# Patient Record
Sex: Female | Born: 1960
Health system: Southern US, Community
[De-identification: ages and names within clinical notes are randomized; demographics above are authoritative.]

## PROBLEM LIST (undated history)

## (undated) DIAGNOSIS — L409 Psoriasis, unspecified: Secondary | ICD-10-CM

## (undated) DIAGNOSIS — F4322 Adjustment disorder with anxiety: Secondary | ICD-10-CM

## (undated) DIAGNOSIS — R0789 Other chest pain: Secondary | ICD-10-CM

## (undated) DIAGNOSIS — E785 Hyperlipidemia, unspecified: Secondary | ICD-10-CM

## (undated) DIAGNOSIS — H9190 Unspecified hearing loss, unspecified ear: Secondary | ICD-10-CM

## (undated) HISTORY — DX: Psoriasis, unspecified: L40.9

## (undated) HISTORY — DX: Unspecified hearing loss, unspecified ear: H91.90

## (undated) HISTORY — DX: Hyperlipidemia, unspecified: E78.5

## (undated) HISTORY — PX: OTHER SURGICAL HISTORY: SHX169

---
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
## 1997-08-07 ENCOUNTER — Inpatient Hospital Stay (HOSPITAL_COMMUNITY): Admission: AD | Admit: 1997-08-07 | Discharge: 1997-08-07 | Payer: Self-pay | Admitting: Obstetrics and Gynecology

## 1997-08-28 ENCOUNTER — Inpatient Hospital Stay (HOSPITAL_COMMUNITY): Admission: AD | Admit: 1997-08-28 | Discharge: 1997-08-30 | Payer: Self-pay | Admitting: Obstetrics and Gynecology

## 1998-10-13 ENCOUNTER — Other Ambulatory Visit: Admission: RE | Admit: 1998-10-13 | Discharge: 1998-10-13 | Payer: Self-pay | Admitting: Obstetrics and Gynecology

## 1998-10-13 ENCOUNTER — Encounter: Payer: Self-pay | Admitting: Emergency Medicine

## 1998-10-13 ENCOUNTER — Emergency Department (HOSPITAL_COMMUNITY): Admission: EM | Admit: 1998-10-13 | Discharge: 1998-10-13 | Payer: Self-pay

## 1999-11-22 ENCOUNTER — Other Ambulatory Visit: Admission: RE | Admit: 1999-11-22 | Discharge: 1999-11-22 | Payer: Self-pay | Admitting: Obstetrics and Gynecology

## 2000-11-23 ENCOUNTER — Other Ambulatory Visit: Admission: RE | Admit: 2000-11-23 | Discharge: 2000-11-23 | Payer: Self-pay | Admitting: Obstetrics and Gynecology

## 2002-03-14 ENCOUNTER — Other Ambulatory Visit: Admission: RE | Admit: 2002-03-14 | Discharge: 2002-03-14 | Payer: Self-pay | Admitting: Obstetrics and Gynecology

## 2002-03-25 ENCOUNTER — Encounter: Admission: RE | Admit: 2002-03-25 | Discharge: 2002-03-25 | Payer: Self-pay | Admitting: Obstetrics and Gynecology

## 2002-03-25 ENCOUNTER — Encounter: Payer: Self-pay | Admitting: Obstetrics and Gynecology

## 2002-07-18 ENCOUNTER — Ambulatory Visit (HOSPITAL_COMMUNITY): Admission: RE | Admit: 2002-07-18 | Discharge: 2002-07-18 | Payer: Self-pay | Admitting: Obstetrics and Gynecology

## 2002-07-18 ENCOUNTER — Encounter (INDEPENDENT_AMBULATORY_CARE_PROVIDER_SITE_OTHER): Payer: Self-pay

## 2003-03-24 ENCOUNTER — Other Ambulatory Visit: Admission: RE | Admit: 2003-03-24 | Discharge: 2003-03-24 | Payer: Self-pay | Admitting: Obstetrics and Gynecology

## 2003-03-31 ENCOUNTER — Encounter: Admission: RE | Admit: 2003-03-31 | Discharge: 2003-03-31 | Payer: Self-pay | Admitting: Family Medicine

## 2004-04-05 ENCOUNTER — Encounter: Admission: RE | Admit: 2004-04-05 | Discharge: 2004-04-05 | Payer: Self-pay | Admitting: Obstetrics and Gynecology

## 2004-04-11 ENCOUNTER — Other Ambulatory Visit: Admission: RE | Admit: 2004-04-11 | Discharge: 2004-04-11 | Payer: Self-pay | Admitting: Obstetrics and Gynecology

## 2005-04-06 ENCOUNTER — Encounter: Admission: RE | Admit: 2005-04-06 | Discharge: 2005-04-06 | Payer: Self-pay | Admitting: Obstetrics and Gynecology

## 2005-05-30 ENCOUNTER — Other Ambulatory Visit: Admission: RE | Admit: 2005-05-30 | Discharge: 2005-05-30 | Payer: Self-pay | Admitting: Obstetrics and Gynecology

## 2006-04-09 ENCOUNTER — Encounter: Admission: RE | Admit: 2006-04-09 | Discharge: 2006-04-09 | Payer: Self-pay | Admitting: Obstetrics and Gynecology

## 2007-05-31 ENCOUNTER — Encounter: Admission: RE | Admit: 2007-05-31 | Discharge: 2007-05-31 | Payer: Self-pay | Admitting: Obstetrics and Gynecology

## 2007-10-01 ENCOUNTER — Ambulatory Visit: Payer: Self-pay | Admitting: Cardiology

## 2007-10-01 LAB — CONVERTED CEMR LAB
Alkaline Phosphatase: 61 units/L (ref 39–117)
BUN: 11 mg/dL (ref 6–23)
Bilirubin, Direct: 0.1 mg/dL (ref 0.0–0.3)
Calcium: 8.6 mg/dL (ref 8.4–10.5)
Cholesterol: 197 mg/dL (ref 0–200)
Direct LDL: 119.4 mg/dL
GFR calc Af Amer: 115 mL/min
GFR calc non Af Amer: 95 mL/min
Glucose, Bld: 82 mg/dL (ref 70–99)
HDL: 60.3 mg/dL (ref >39.0)
Potassium: 4.3 meq/L (ref 3.5–5.1)
Total CHOL/HDL Ratio: 3.3
VLDL: 14 mg/dL (ref 0–40)

## 2008-05-01 ENCOUNTER — Ambulatory Visit: Payer: Self-pay | Admitting: Cardiology

## 2008-05-01 LAB — CONVERTED CEMR LAB
Cholesterol: 201 mg/dL (ref 0–200)
Direct LDL: 117.4 mg/dL
Total CHOL/HDL Ratio: 3.2

## 2008-06-04 ENCOUNTER — Encounter: Admission: RE | Admit: 2008-06-04 | Discharge: 2008-06-04 | Payer: Self-pay | Admitting: Obstetrics and Gynecology

## 2009-06-11 ENCOUNTER — Encounter: Admission: RE | Admit: 2009-06-11 | Discharge: 2009-06-11 | Payer: Self-pay | Admitting: Obstetrics and Gynecology

## 2009-06-11 LAB — HM MAMMOGRAPHY

## 2009-08-27 ENCOUNTER — Encounter: Payer: Self-pay | Admitting: Internal Medicine

## 2009-08-27 LAB — CONVERTED CEMR LAB
ALT: 13 units/L
AST: 14 units/L
Albumin: 4.1 g/dL
Cholesterol: 226 mg/dL
Glucose, Bld: 86 mg/dL
LDL Cholesterol: 130.5 mg/dL
Sodium: 139 meq/L
Triglyceride fasting, serum: 88 mg/dL

## 2009-09-28 ENCOUNTER — Encounter: Payer: Self-pay | Admitting: Internal Medicine

## 2009-09-28 ENCOUNTER — Ambulatory Visit: Payer: Self-pay | Admitting: Internal Medicine

## 2009-09-28 DIAGNOSIS — L408 Other psoriasis: Secondary | ICD-10-CM | POA: Insufficient documentation

## 2009-09-28 DIAGNOSIS — E785 Hyperlipidemia, unspecified: Secondary | ICD-10-CM | POA: Insufficient documentation

## 2009-09-28 HISTORY — DX: Other psoriasis: L40.8

## 2010-03-20 ENCOUNTER — Encounter: Payer: Self-pay | Admitting: Obstetrics and Gynecology

## 2010-03-29 NOTE — Assessment & Plan Note (Signed)
Summary: NEW PT CPX/ WILL BRING LAB RESULTS/NWS  #   Vital Signs:  Patient profile:   50 year old female Height:      65 inches (165.10 cm) Weight:      161.12 pounds (73.24 kg) BMI:     26.91 O2 Sat:      97 % on Room air Temp:     99.2 degrees F (37.33 degrees C) oral Pulse rate:   91 / minute BP sitting:   110 / 72  (left arm) Cuff size:   regular  Vitals Entered By: Orlan Leavens RMA (September 28, 2009 3:13 PM)  O2 Flow:  Room air CC: New patient CPX Is Patient Diabetic? No Pain Assessment Patient in pain? no      Comments Patient states she use to take simvastatin, but she stop taking   Primary Care Ajeenah Heiny:  Newt Lukes MD  CC:  New patient CPX.  History of Present Illness: new pt to me and our division - seen remotely by cards (2009 x 1) here to est care -  patient is here today for annual physical. Patient feels well and has no complaints.   Preventive Screening-Counseling & Management  Alcohol-Tobacco     Alcohol drinks/day: <1     Alcohol Counseling: not indicated; use of alcohol is not excessive or problematic     Smoking Status: never     Tobacco Counseling: not indicated; no tobacco use  Caffeine-Diet-Exercise     Does Patient Exercise: yes     Exercise (avg: min/session): <30     Exercise Counseling: to improve exercise regimen     Depression Counseling: not indicated; screening negative for depression  Safety-Violence-Falls     Seat Belt Counseling: not indicated; patient wears seat belts     Helmet Counseling: not indicated; patient wears helmet when riding bicycle/motocycle     Firearms in the Home: firearms in the home     Firearm Counseling: not indicated; uses recommended firearm safety measures     Violence Counseling: not indicated; no violence risk noted     Fall Risk Counseling: not indicated; no significant falls noted  Clinical Review Panels:  Prevention   Last Mammogram:  Assessment: BIRADS 1. [facility]: [facility].   No specific mammographic evidence of malignancy.   (06/11/2009)   Last Pap Smear:  Pt states was abnormal, goin to have ultrasound to check for endometrius (08/27/2009)  Lipid Management   Cholesterol:  226 (08/27/2009)   LDL (bad choesterol):  130.5 (08/27/2009)   HDL (good cholesterol):  78.1 (08/27/2009)   Triglycerides:  88 (08/27/2009)  Complete Metabolic Panel   Glucose:  86 (08/27/2009)   Sodium:  139 (08/27/2009)   Potassium:  4.4 (08/27/2009)   Chloride:  104 (08/27/2009)   CO2:  28 (08/27/2009)   BUN:  10 (08/27/2009)   Creatinine:  0.53 (08/27/2009)   Albumin:  4.1 (08/27/2009)   Total Protein:  7.5 (08/27/2009)   Calcium:  9.0 (08/27/2009)   Total Bili:  0.6 (08/27/2009)   Alk Phos:  66 (08/27/2009)   SGPT (ALT):  13 (08/27/2009)   SGOT (AST):  14 (08/27/2009)   -  Date:  08/27/2009    Cholesterol: 226    LDL: 130.5    HDL: 78.1    Triglycerides: 88    BG Random: 86    BUN: 10    Creatinine: 0.53    Sodium: 139    Potassium: 4.4    Chloride:  104    CO2 Total: 28    SGOT (AST): 14    SGPT (ALT): 13    T. Bilirubin: 0.6    Alk Phos: 66    Calcium: 9.0    Total Protein: 7.5    Albumin: 4.1  Current Medications (verified): 1)  Caltrate 600+d 600-400 Mg-Unit Tabs (Calcium Carbonate-Vitamin D) .... 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 I have reviewed all other systems and they were negative.   Physical Exam  General:  alert, well-developed, well-nourished, and cooperative to examination.    Eyes:  vision grossly intact; pupils equal, round and reactive to light.  conjunctiva and lids normal.    Ears:  left hearing aide - normal pinnae bilaterally, without erythema, swelling, or tenderness to palpation. TMs clear, without effusion, or cerumen impaction. Hearing grossly normal bilaterally  Mouth:  teeth and gums in good repair; mucous membranes moist, without lesions or ulcers. oropharynx clear without exudate, no erythema.  Neck:  supple, full ROM, no masses, no thyromegaly; no thyroid nodules or tenderness. no JVD or carotid bruits.   Lungs:  normal respiratory effort, no intercostal retractions or use of accessory muscles; normal breath sounds bilaterally - no crackles and no wheezes.    Heart:  normal rate, regular rhythm, no murmur, and no rub. BLE without edema.  Abdomen:  soft, non-tender, normal bowel sounds, no distention; no masses and no appreciable hepatomegaly or splenomegaly.   Genitalia:  defer to gyn Msk:  No deformity or scoliosis noted of thoracic or lumbar spine.   Neurologic:  alert & oriented X3 and cranial nerves II-XII symetrically intact.  strength normal in all extremities, sensation intact to light touch, and gait normal. speech fluent without dysarthria or aphasia; follows commands with good comprehension.  Skin:  very tan - solar changes -otherwise no rashes, vesicles, ulcers, or erythema. No nodules or irregularity to palpation.  Psych:  Oriented X3, memory intact for recent and remote, normally interactive, good eye contact, not anxious appearing, not depressed appearing, and not agitated.      Impression & Recommendations:  Problem # 1:  PREVENTIVE HEALTH CARE  (ICD-V70.0)  Patient has been counseled on age-appropriate routine health concerns for screening and prevention. These are reviewed and up-to-date. Immunizations are up-to-date or declined. Labs and ECG reviewed. gyn care as ongoing, mammo UTD  Orders: TLB-TSH (Thyroid Stimulating Hormone) (84443-TSH) TLB-CBC Platelet - w/Differential (85025-CBCD) EKG w/ Interpretation (93000)  Problem # 2:  HYPERLIPIDEMIA (ICD-272.4)  good ration with high HDL - +FH but no other CRF, would not pursue primary prevention at this time - cont to watch weight, heart healthy diet and exercise supplements like omega 3 or red yeast rice ok f/u annually to monitor  Labs Reviewed: SGOT: 14 (08/27/2009)   SGPT:  13 (08/27/2009)   HDL:78.1 (08/27/2009), 62.7 (05/01/2008)  LDL:130.5 (08/27/2009), DEL (05/01/2008)  Chol:226 (08/27/2009), 201 (05/01/2008)  Trig:88 (08/27/2009), 44 (05/01/2008)  Complete Medication List: 1)  Caltrate 600+d 600-400 Mg-unit Tabs (Calcium carbonate-vitamin d) .... Take 1 by mouth once daily 2)  Alprazolam 0.5 Mg Tbdp (Alprazolam) .... As needed - per gyn ([facility])  Contraindications/Deferment of Procedures/Staging:    Test/Procedure: TD vaccine    Reason for deferment: declined   Patient Instructions: 1)  it was good to see you today. 2)  labs reviewed - get TSH and CBC to complete physical labs - 3)  exam and EKG look good 4)  no recommendation for cholesterol medication at this time - but stay active, watch your weight and eat heart healthy - 5)  Please schedule a follow-up appointment annually for medical physical and labs, sooner if problems.    Pap Smear  Procedure date:  08/27/2009  Findings:      Pt states was abnormal, goin to have ultrasound to check for endometrius

## 2010-05-11 ENCOUNTER — Encounter: Payer: Self-pay | Admitting: Internal Medicine

## 2010-05-11 ENCOUNTER — Ambulatory Visit (INDEPENDENT_AMBULATORY_CARE_PROVIDER_SITE_OTHER): Payer: BC Managed Care – PPO | Admitting: Internal Medicine

## 2010-05-11 DIAGNOSIS — E663 Overweight: Secondary | ICD-10-CM

## 2010-05-11 DIAGNOSIS — L408 Other psoriasis: Secondary | ICD-10-CM

## 2010-05-11 HISTORY — DX: Overweight: E66.3

## 2010-05-17 NOTE — Assessment & Plan Note (Signed)
Summary: DISCUSS DIET MEDICATION/ SPOT ON WRIST /NWS #   Vital Signs:  Patient profile:   50 year old female Weight:      163.8 pounds (74.45 kg) BMI:     27.36 O2 Sat:      97 % on Room air Temp:     98.6 degrees F (37.00 degrees C) oral Pulse rate:   69 / minute BP sitting:   122 / 86  (left arm) Cuff size:   regular  Vitals Entered By: Orlan Leavens RMA (May 11, 2010 9:25 AM)  O2 Flow:  Room air CC: Discuss diet med & Spot on wrist Is Patient Diabetic? No Pain Assessment Patient in pain? no        Primary Care Provider:  Newt Lukes MD  CC:  Discuss diet med & Spot on wrist.  History of Present Illness: concerned about spot on wrist onset 3-4 months ago, no change in size since onset a/w itch and irritation - no bleeding, or spreading no other body area affected - no exposure to outdoors or new prodcuts hx same with psorisasis flare but never at this spot  also requests weight loss med for upcoming event - goal 10# les in next 2 months has used phent woith good success in past -  also working on calorie reduction and inc exercise   Preventive Screening-Counseling & Management  Alcohol-Tobacco     Alcohol drinks/day: <1     Alcohol Counseling: not indicated; use of alcohol is not excessive or problematic     Smoking Status: never     Tobacco Counseling: not indicated; no tobacco use  Clinical Review Panels:  Lipid Management   Cholesterol:  226 (08/27/2009)   LDL (bad choesterol):  130.5 (08/27/2009)   HDL (good cholesterol):  78.1 (08/27/2009)   Triglycerides:  88 (08/27/2009)  Complete Metabolic Panel   Glucose:  86 (08/27/2009)   Sodium:  139 (08/27/2009)   Potassium:  4.4 (08/27/2009)   Chloride:  104 (08/27/2009)   CO2:  28 (08/27/2009)   BUN:  10 (08/27/2009)   Creatinine:  0.53 (08/27/2009)   Albumin:  4.1 (08/27/2009)   Total Protein:  7.5 (08/27/2009)   Calcium:  9.0 (08/27/2009)   Total Bili:  0.6 (08/27/2009)   Alk Phos:  66  (08/27/2009)   SGPT (ALT):  13 (08/27/2009)   SGOT (AST):  14 (08/27/2009)   Current Medications (verified): 1)  Caltrate 600+d 600-400 Mg-Unit Tabs (Calcium Carbonate-Vitamin D) .... Take 1 By Mouth Once Daily 2)  Alprazolam 0.5 Mg Tbdp (Alprazolam) .... As Needed - Per Gyn ([facility])  Allergies (verified): No Known Drug Allergies  Past History:  Past Medical History: Hyperlipidemia psoriasis perimenopausal   MD roster: gyn - [facility]   Review of Systems       The patient complains of weight gain.  The patient denies anorexia, chest pain, syncope, and headaches.    Physical Exam  General:  alert, well-developed, well-nourished, and cooperative to examination.    Lungs:  normal respiratory effort, no intercostal retractions or use of accessory muscles; normal breath sounds bilaterally - no crackles and no wheezes.    Heart:  normal rate, regular rhythm, no murmur, and no rub. BLE without edema.  Skin:  small pink plaque at left wrist, base of thumb (72mmx1mm) with scaling - no discoloration or ulceration - no cellulitis Psych:  Oriented X3, memory intact for recent and remote, normally interactive, good eye contact, not anxious appearing, not depressed appearing, and  not agitated.      Impression & Recommendations:  Problem # 1:  PSORIASIS (ICD-696.1) use steroid ointment at wirst and call if unmproved after 3 weeks for derm eval - pt understands and agrees  Problem # 2:  OVERWEIGHT (ICD-278.02)  BMI 27 - discussed need for lifestyle changes as ongoing rather than rx tx but 30d med given to help pt with short term goals -  no refills unless bmi >30  Ht: 65 (09/28/2009)   Wt: 163.8 (05/11/2010)   BMI: 27.36 (05/11/2010)  Complete Medication List: 1)  Caltrate 600+d 600-400 Mg-unit Tabs (Calcium carbonate-vitamin d) .... Take 1 by mouth once daily 2)  Alprazolam 0.5 Mg Tbdp (Alprazolam) .... As needed - per gyn ([facility]) 3)  Phentermine Hcl 37.5 Mg Caps (Phentermine hcl)  .Marland Kitchen.. 1 by mouth once daily  Patient Instructions: 1)  it was good to see you today. 2)  use steroid ointment to rash on wrist as discussed - if not improved in next 3 weeks, call for refer to dermatology 3)  use phentermine as discussed for your weight loss goals - no refills will be provided unless BMI >30 4)  no recommendation for cholesterol medication at this time - but stay active, watch your weight and eat heart healthy - 5)  Please keep scheduled appointment for annually medical physical and labs, call sooner if problems.  Prescriptions: PHENTERMINE HCL 37.5 MG CAPS (PHENTERMINE HCL) 1 by mouth once daily  #30 x 0   Entered and Authorized by:   Newt Lukes MD   Signed by:   Newt Lukes MD on 05/11/2010   Method used:   Print then Give to Patient   RxID:   [id number]    Orders Added: 1)  Est. Patient Level IV [14782]

## 2010-05-23 ENCOUNTER — Other Ambulatory Visit: Payer: Self-pay | Admitting: Obstetrics and Gynecology

## 2010-05-23 DIAGNOSIS — Z1231 Encounter for screening mammogram for malignant neoplasm of breast: Secondary | ICD-10-CM

## 2010-06-17 ENCOUNTER — Ambulatory Visit: Payer: BC Managed Care – PPO

## 2010-06-23 ENCOUNTER — Ambulatory Visit
Admission: RE | Admit: 2010-06-23 | Discharge: 2010-06-23 | Disposition: A | Discharge status: Home / Self Care | Payer: BC Managed Care – PPO | Source: Ambulatory Visit | Attending: Obstetrics and Gynecology | Admitting: Obstetrics and Gynecology

## 2010-06-23 DIAGNOSIS — Z1231 Encounter for screening mammogram for malignant neoplasm of breast: Secondary | ICD-10-CM

## 2010-07-12 NOTE — Assessment & Plan Note (Signed)
HEALTHCARE                            CARDIOLOGY OFFICE NOTE   SHARVI, MOONEYHAN                    MRN:          [id number]  DATE:10/01/2007                            DOB:          17-May-1960    PRIMARY CARE PHYSICIAN:  Juluis Mire, MD   HISTORY OF PRESENT ILLNESS:  This is a 50 year old with a strong family  history of coronary artery disease who presents to Cardiology Clinic for  evaluation of elevated lipids.  The patient is actually asymptomatic  from a cardiac standpoint.  She does not get chest pain or shortness of  breath with exertion.  She does not have orthopnea or PND.  No episodes  of syncope or lightheadedness.  However, she has a friend who works in  [employer] who drew a blood for a cholesterol panel and the  total cholesterol result came back at 274.  The patient became worried  and scheduled this appointment for herself.  Hence the EKG today, has  normal sinus rhythm.  No ST-T wave changes.   PAST MEDICAL HISTORY:  Hypercholesterolemia.   SOCIAL HISTORY:  The patient does not smoke.  She drinks 1 to 2 glasses  of wine a day.  No illicit drugs.  She works as a Human resources officer  for [employer].  She does have 3 children and she is divorced.   FAMILY HISTORY:  The patient's father has first heart attack in his late  25s.  He had a bypass surgery at 71 and then died of a heart attack at  86.  Her grandfather died of a heart attack at age of 63.  The mother  does not have known coronary disease, but does have a pacemaker.   REVIEW OF SYSTEMS:  Negative, except as noted in the history of present  illness.   PHYSICAL EXAMINATION:  VITAL SIGNS:  Blood pressure 115/86, heart rate  76 and regular, and weight is 164.  GENERAL:  No apparent distress.  Well-developed female with mild  obesity.  NEURO:  Alert and oriented x3.  Normal affect.  NECK:  No JVD.  LUNGS:  Clear to auscultation bilaterally.  HEART:  Regular, S1 and S2.  No S3 and no S4.  No carotid bruits.  There  is no lower extremity edema.  2+ dorsalis pedis pulses bilaterally.  ABDOMEN:  Soft, nontender, and mildly obese.  No hepatosplenomegaly.  EXTREMITIES:  No clubbing or cyanosis.   ASSESSMENT AND PLAN:  This is a 50 year old with a family history of  premature coronary artery disease who comes for evaluation of elevated  total cholesterol.  The patient's total cholesterol was apparently  elevated per her report 274.  I cannot find any record of that and she  does not have a record of that either.  Therefore, today, we will draw a  fasting lipid profile, high resolution CRP, and a complete metabolic  panel.  If her LDL is indeed elevated, I would have a low threshold to  treat with her statin given the history of premature coronary artery  disease in her family.  The  patient also tells me that she has no  history of easy bleeding, therefore, I will go ahead and have her start  taking a baby aspirin a day.  She will also start taking 2 g a day fish  oil from over-the-counter.  Additionally, we talked about her weight and  we will have her start an exercise program.  She already has joined a  gym.  We talked about diet and exercise.  We will refer her to see the  nutritionist at [facility].     Marca Ancona, MD  Electronically Signed    DM/MedQ  DD: 10/01/2007  DT: 10/02/2007  Job #: [id number]   cc:   Everardo Beals. Juanda Chance, MD, [facility]

## 2010-07-15 NOTE — Op Note (Signed)
   NAME:  Gina Goodwin, Gina Goodwin                     ACCOUNT NO.:  [id number]   MEDICAL RECORD NO.:  [id number]                   PATIENT TYPE:  AMB   LOCATION:  [facility]                                  FACILITY:  [facility]   PHYSICIAN:  Juluis Mire, M.D.                DATE OF BIRTH:  [birth year]   DATE OF PROCEDURE:  07/18/2002  DATE OF DISCHARGE:                                 OPERATIVE REPORT   PREOPERATIVE DIAGNOSES:  1. Menorrhagia.  2. Endometrial polyp.   POSTOPERATIVE DIAGNOSES:  1. Menorrhagia.  2. Endometrial polyp.   PROCEDURES:  1. Paracervical block.  2. Cervical dilation.  3. Hysteroscopy and resection of endometrium.  4. Cryoablation.   SURGEON:  Juluis Mire, M.D.   ANESTHESIA:  Sedation with paracervical block.   ESTIMATED BLOOD LOSS:  Minimal.   PACKS AND DRAINS:  None.   BLOOD REPLACED:  None.   COMPLICATIONS:  None.   INDICATIONS:  Dictated in the history and physical.   DESCRIPTION OF PROCEDURE:  The patient was taken to the OR and placed in the  supine position.  After sedation, was placed in the dorsal lithotomy  position using the Allen stirrups.  The patient was draped out for  hysteroscopy.  A speculum was placed in the vaginal vault.  The cervix and  vagina were cleansed with Betadine.  A paracervical block was instilled  using 1% Nesacaine.  The cervix was grasped with a single-tooth tenaculum.  The uterus was posterior, sounded to approximately 10 cm.  The cervix was  serially dilated to a size 35 Pratt dilator.  Operative hysteroscope was  introduced.  There was no distinct polyp but did have markedly thickened  endometrium.  We resected the majority of the endometrium and sent it for  pathologic review.  There was no sign of perforation or complications.   At his point in time we used the Her Choice cryogen machine.  We did to six-  minute freezes on the left and right side of the uterus and then a two-  minute freeze in the lower uterine  segment.  The patient tolerated the  procedure  well.  At this point in time the single-tooth and speculum were removed.  The patient was taken out of the dorsal lithotomy position and once alert  transferred to the recovery room in good condition.  Sponge, needle, and  instrument count reported as correct by the circulating nurse.                                               Juluis Mire, M.D.    JSM/MEDQ  D:  07/18/2002  T:  07/18/2002  Job:  [id number]

## 2010-07-15 NOTE — H&P (Signed)
NAME:  Gina Goodwin, Gina Goodwin                     ACCOUNT NO.:  [id number]   MEDICAL RECORD NO.:  [id number]                   PATIENT TYPE:  AMB   LOCATION:  [facility]                                  FACILITY:  [facility]   PHYSICIAN:  Juluis Mire, M.D.                DATE OF BIRTH:  [birth year]   DATE OF ADMISSION:  07/18/2002  DATE OF DISCHARGE:                                HISTORY & PHYSICAL   HISTORY OF PRESENT ILLNESS:  The patient is a 50 year old gravida 4, para 3,  abortus 1, married white female presents for hysteroscopy as well as  endometrial ablation.   In relation to the present admission, period cycles are regular at the  present time.  She does have significant flow problems with her cycles.  She  describes heavy flow that has become extremely limiting.  She had been  placed on birth control pills but has trouble with midcycle spotting and  discomfort.  She had a saline infusion ultrasound that did reveal an  endometrial polyp.  In view of the bleeding pattern and the polyp, she  presents now for hysteroscopy as well as endometrial ablation.   ALLERGIES:  She has no known drug allergies.   MEDICATIONS:  Medications include Prozac.  Amerge as needed.   PAST MEDICAL HISTORY:  Usual childhood diseases; no significant sequela.   PAST SURGICAL HISTORY:  No surgical history besides a D&C for a nonviable  pregnancy.   OBSTETRICAL HISTORY:  She had three spontaneous vaginal deliveries.   FAMILY HISTORY:  Father with a history of MI at age 32.  So there is a  family history of early heart disease.  Father also had hypertension.  Maternal grandmother with breast cancer and mastectomy in her 78s.   SOCIAL HISTORY:  No tobacco or alcohol use.   REVIEW OF SYSTEMS:  Noncontributory.   PHYSICAL EXAMINATION:  VITAL SIGNS:  The patient is afebrile with stable  vital signs.  HEENT:  The patient normocephalic.  Pupils equal, round and reactive to  light and accomodation.  Extraocular  movements are intact.  Sclerae and  conjunctivae are clear.  Oropharynx clear.  NECK:  Without thyromegaly.  BREASTS:  No discrete masses.  LUNGS:  Clear.  CARDIOVASCULAR:  Regular rate; no murmurs or gallops.  ABDOMEN:  Benign; no mass, organomegaly or tenderness.  PELVIC:  Normal external genitalia.  Vagina mucosa is clear.  Cervix  unremarkable.  Uterus normal size, shape, and contour.  Adnexa free of  masses or tenderness.  Rectovaginal exam is clear.  EXTREMITIES:  Trace edema.  NEUROLOGIC:  Grossly within normal limits.   IMPRESSION:  1. Menorrhagia.  2. Endometrial polyp.   PLAN:  The patient to undergo hysteroscopy, removal of polyp, subsequent  cryoablation.  Success rates of 7% are quoted.  Risks of surgery have been  explained including the risk of infection.  The risk of vascular injury that  could  lead to hemorrhage or possible hysterectomy.  Risk of perforation that  could lead to injury to bowel requiring laparoscopy and possible exploratory  laparotomy, the risk of deep vein thrombosis and pulmonary embolus.  The  patient expressed understanding of indication and risks.                                               Juluis Mire, M.D.    JSM/MEDQ  D:  07/18/2002  T:  07/18/2002  Job:  [id number]

## 2011-01-30 LAB — HEPATIC FUNCTION PANEL
ALT: 15 U/L (ref 7–35)
AST: 18 U/L (ref 13–35)
Alkaline Phosphatase: 64 U/L (ref 25–125)
Bilirubin, Direct: 0.1 mg/dL (ref 0.01–0.4)
Bilirubin, Total: 0.5 mg/dL

## 2011-01-30 LAB — LIPID PANEL
Cholesterol: 221 mg/dL — AB (ref 0–200)
HDL: 72 mg/dL — AB (ref 35–70)
LDL Cholesterol: 133 mg/dL
Triglycerides: 59 mg/dL (ref 40–160)

## 2011-01-30 LAB — BASIC METABOLIC PANEL
BUN: 7 mg/dL (ref 4–21)
Glucose: 90 mg/dL
Potassium: 4.6 mmol/L (ref 3.4–5.3)
Sodium: 139 mmol/L (ref 137–147)

## 2011-01-30 LAB — TSH: TSH: 0.94 u[IU]/mL (ref 0.41–5.90)

## 2011-03-10 ENCOUNTER — Encounter: Payer: Self-pay | Admitting: Internal Medicine

## 2011-03-13 ENCOUNTER — Encounter: Payer: Self-pay | Admitting: Internal Medicine

## 2011-03-13 ENCOUNTER — Ambulatory Visit (INDEPENDENT_AMBULATORY_CARE_PROVIDER_SITE_OTHER): Payer: BC Managed Care – PPO | Admitting: Internal Medicine

## 2011-03-13 VITALS — BP 110/80 | HR 73 | Temp 98.3°F | Wt 158.0 lb

## 2011-03-13 DIAGNOSIS — N390 Urinary tract infection, site not specified: Secondary | ICD-10-CM

## 2011-03-13 DIAGNOSIS — Z0000 Encounter for general adult medical examination without abnormal findings: Secondary | ICD-10-CM

## 2011-03-13 DIAGNOSIS — Z23 Encounter for immunization: Secondary | ICD-10-CM

## 2011-03-13 LAB — POCT URINALYSIS DIPSTICK
Bilirubin, UA: NEGATIVE
Nitrite, UA: POSITIVE
Protein, UA: NEGATIVE
Spec Grav, UA: 1.02
Urobilinogen, UA: 0.2
pH, UA: 8

## 2011-03-13 MED ORDER — CIPROFLOXACIN HCL 500 MG PO TABS: 500.0000 mg | ORAL_TABLET | Freq: Two times a day (BID) | ORAL | Status: AC

## 2011-03-13 NOTE — Progress Notes (Signed)
Subjective:    Patient ID: Gina Goodwin, female    DOB: [birth year], 51 y.o.   MRN: [id number]  HPI patient is here today for annual physical. Patient feels well overall -  complains of dysuria - typical of prior UTI symptoms   Past Medical History  Diagnosis Date  . Hyperlipidemia   . Psoriasis    Family History  Problem Relation Age of Onset  . Cancer Other     Breast - 1st degree relative <50  . Hyperlipidemia Other     Parent, Grandparent  . Heart disease Other     parent, grandparent, other relative   History  Substance Use Topics  . Smoking status: Never Smoker   . Smokeless tobacco: Not on file  . Alcohol Use: Yes     Occasional     Review of Systems Constitutional: Negative for fever or weight change.  Respiratory: Negative for cough and shortness of breath.   Cardiovascular: Negative for chest pain or palpitations.  Gastrointestinal: Negative for abdominal pain, no bowel changes.  Musculoskeletal: Negative for gait problem or joint swelling.  Skin: Negative for rash.  Neurological: Negative for dizziness or headache.  No other specific complaints in a complete review of systems (except as listed in HPI above).     Objective:   Physical Exam BP 110/80  Pulse 73  Temp(Src) 98.3 F (36.8 C) (Oral)  Wt 158 lb (71.668 kg)  SpO2 98% Wt Readings from Last 3 Encounters:  03/13/11 158 lb (71.668 kg)  05/11/10 163 lb 12.8 oz (74.299 kg)  09/28/09 161 lb 1.9 oz (73.083 kg)   Constitutional: She appears well-developed and well-nourished. No distress.  HENT: Head: Normocephalic and atraumatic. Ears: B TMs ok, no erythema or effusion; Nose: Nose normal.  Mouth/Throat: Oropharynx is clear and moist. No oropharyngeal exudate.  Eyes: Conjunctivae and EOM are normal. Pupils are equal, round, and reactive to light. No scleral icterus.  Neck: Normal range of motion. Neck supple. No JVD present. No thyromegaly present.  Cardiovascular: Normal rate, regular rhythm  and normal heart sounds.  No murmur heard. No BLE edema. Pulmonary/Chest: Effort normal and breath sounds normal. No respiratory distress. She has no wheezes.  Abdominal: Soft. Bowel sounds are normal. She exhibits no distension. There is no tenderness. no masses Musculoskeletal: Normal range of motion, no joint effusions. No gross deformities Neurological: She is alert and oriented to person, place, and time. No cranial nerve deficit. Coordination normal.  Skin: Skin is warm and dry. No rash noted. No erythema.  Psychiatric: She has a normal mood and affect. Her behavior is normal. Judgment and thought content normal.   Lab Results  Component Value Date   GLUCOSE 86 08/27/2009   CHOL 221* 01/30/2011   TRIG 59 01/30/2011   HDL 72* 01/30/2011   LDLDIRECT 117.4 05/01/2008   LDLCALC 133 01/30/2011   ALT 15 01/30/2011   AST 18 01/30/2011   NA 139 01/30/2011   K 4.6 01/30/2011   CL 104 08/27/2009   CREATININE 0.53 08/27/2009   BUN 7 01/30/2011   CO2 28 08/27/2009   TSH 0.94 01/30/2011   Udip = pos nit, LE today-    Assessment & Plan:  CPx- v70.0 - Patient has been counseled on age-appropriate routine health concerns for screening and prevention. These are reviewed and up-to-date. Immunizations are up-to-date or declined. Labs  Reviewed. Planning colo this year with [facility] (refer done by gyn)   UTI - classic symptoms with +Udip - Cipro x 3  days - erx done

## 2011-03-13 NOTE — Patient Instructions (Addendum)
It was good to see you today. We have reviewed your prior records including labs and tests today Let us know if you need help arranging your colonoscopy with Dr. Kinnie Scales and continue to work with Dr. Arelia Sneddon as ongoing-  Your "high" cholesterol is related to high levels of HDL - work on increase aerobic exercise, low fat diet and weight control but no medications recommended  Medications reviewed, no changes at this time.  Cipro x 3 days for your UTI - Your prescription(s) have been submitted to your pharmacy. Please take as directed and contact our office if you believe you are having problem(s) with the medication(s). Please schedule followup in 12 months for medical physical/labs, call sooner if problems.

## 2011-07-12 ENCOUNTER — Other Ambulatory Visit: Payer: Self-pay | Admitting: Obstetrics and Gynecology

## 2011-07-12 DIAGNOSIS — Z1231 Encounter for screening mammogram for malignant neoplasm of breast: Secondary | ICD-10-CM

## 2011-07-27 ENCOUNTER — Ambulatory Visit
Admission: RE | Admit: 2011-07-27 | Discharge: 2011-07-27 | Disposition: A | Discharge status: Home / Self Care | Payer: BC Managed Care – PPO | Source: Ambulatory Visit | Attending: Obstetrics and Gynecology | Admitting: Obstetrics and Gynecology

## 2011-07-27 DIAGNOSIS — Z1231 Encounter for screening mammogram for malignant neoplasm of breast: Secondary | ICD-10-CM

## 2011-10-26 ENCOUNTER — Telehealth: Payer: Self-pay | Admitting: *Deleted

## 2011-10-26 NOTE — Telephone Encounter (Signed)
Left msg on vm had her cholesterol check total was 239 continue to be over 200. Just wanted md to know. Called pt back ask her can she fax Dr. Felicity Coyer a copy of results that way she can compare. Will fax copy..../LMB

## 2011-11-28 LAB — HM COLONOSCOPY

## 2012-03-18 ENCOUNTER — Other Ambulatory Visit: Payer: Self-pay | Admitting: Obstetrics and Gynecology

## 2012-03-18 DIAGNOSIS — Z1231 Encounter for screening mammogram for malignant neoplasm of breast: Secondary | ICD-10-CM

## 2012-04-10 ENCOUNTER — Other Ambulatory Visit: Payer: Self-pay | Admitting: Dermatology

## 2012-05-23 ENCOUNTER — Encounter: Payer: BC Managed Care – PPO | Admitting: Internal Medicine

## 2012-06-03 ENCOUNTER — Other Ambulatory Visit: Payer: Self-pay | Admitting: *Deleted

## 2012-06-03 ENCOUNTER — Ambulatory Visit (INDEPENDENT_AMBULATORY_CARE_PROVIDER_SITE_OTHER): Payer: BC Managed Care – PPO | Admitting: Internal Medicine

## 2012-06-03 ENCOUNTER — Encounter: Payer: Self-pay | Admitting: Internal Medicine

## 2012-06-03 ENCOUNTER — Other Ambulatory Visit (INDEPENDENT_AMBULATORY_CARE_PROVIDER_SITE_OTHER): Payer: BC Managed Care – PPO

## 2012-06-03 VITALS — BP 112/82 | HR 75 | Temp 98.6°F | Wt 160.8 lb

## 2012-06-03 DIAGNOSIS — E785 Hyperlipidemia, unspecified: Secondary | ICD-10-CM

## 2012-06-03 DIAGNOSIS — Z0000 Encounter for general adult medical examination without abnormal findings: Secondary | ICD-10-CM

## 2012-06-03 DIAGNOSIS — E663 Overweight: Secondary | ICD-10-CM

## 2012-06-03 LAB — LIPID PANEL
HDL: 68 mg/dL (ref >39.00)
VLDL: 15.8 mg/dL (ref 0.0–40.0)

## 2012-06-03 LAB — CBC WITH DIFFERENTIAL/PLATELET
Eosinophils Absolute: 0.2 10*3/uL (ref 0.0–0.7)
Hemoglobin: 14.2 g/dL (ref 12.0–15.0)
Lymphs Abs: 1.8 10*3/uL (ref 0.7–4.0)
Monocytes Absolute: 0.3 10*3/uL (ref 0.1–1.0)
Monocytes Relative: 5.4 % (ref 3.0–12.0)
Neutro Abs: 3.2 10*3/uL (ref 1.4–7.7)
Neutrophils Relative %: 58.6 % (ref 43.0–77.0)
Platelets: 318 10*3/uL (ref 150.0–400.0)
RDW: 13.1 % (ref 11.5–14.6)
WBC: 5.5 10*3/uL (ref 4.5–10.5)

## 2012-06-03 LAB — HEPATIC FUNCTION PANEL
ALT: 15 U/L (ref 0–35)
AST: 19 U/L (ref 0–37)
Albumin: 4.2 g/dL (ref 3.5–5.2)
Bilirubin, Direct: 0.1 mg/dL (ref 0.0–0.3)

## 2012-06-03 LAB — BASIC METABOLIC PANEL
CO2: 28 mEq/L (ref 19–32)
Calcium: 9 mg/dL (ref 8.4–10.5)
Chloride: 103 mEq/L (ref 96–112)

## 2012-06-03 LAB — TSH: TSH: 0.88 u[IU]/mL (ref 0.35–5.50)

## 2012-06-03 MED ORDER — ATORVASTATIN CALCIUM 20 MG PO TABS: 20.0000 mg | ORAL_TABLET | Freq: Every day | ORAL | Status: DC

## 2012-06-03 NOTE — Assessment & Plan Note (Signed)
Prescribed phentermine to use as needed by gynecologist early 2014 Okay to continue same while working on diet/exercise Weight trends reviewed Support and education provided

## 2012-06-03 NOTE — Assessment & Plan Note (Signed)
History of same, rising trend despite control of weight and diet per patient report Will begin low-dose statin at this time because of strong family history CAD Atorvastatin 20 mg daily, we reviewed potential risk/benefit and possible side effects - pt understands and agrees to same  Recheck labs to monitor effect in 12 weeks, patient to call sooner if problems

## 2012-06-03 NOTE — Progress Notes (Signed)
Subjective:    Patient ID: Gina Goodwin, female    DOB: [birth year], 52 y.o.   MRN: [id number]  HPI  patient is here today for annual physical. Patient feels well overall -  Concerned about increasing trend in cholesterol/LDL, ?need meds for same   Past Medical History  Diagnosis Date  . Hyperlipidemia   . Psoriasis   . Hearing loss     wears aide on Left side   Family History  Problem Relation Age of Onset  . Breast cancer      Breast - 1st degree relative <50  . Hyperlipidemia Father   . Heart disease Father    History  Substance Use Topics  . Smoking status: Never Smoker   . Smokeless tobacco: Not on file  . Alcohol Use: Yes     Comment: Occasional     Review of Systems  Constitutional: Negative for fever or weight change.  Respiratory: Negative for cough and shortness of breath.   Cardiovascular: Negative for chest pain or palpitations.  Gastrointestinal: Negative for abdominal pain, no bowel changes.  Musculoskeletal: Negative for gait problem or joint swelling.  Skin: Negative for rash.  Neurological: Negative for dizziness or headache.  No other specific complaints in a complete review of systems (except as listed in HPI above).     Objective:   Physical Exam  BP 112/82  Pulse 75  Temp(Src) 98.6 F (37 C) (Oral)  Wt 160 lb 12.8 oz (72.938 kg)  BMI 26.76 kg/m2  SpO2 96% Wt Readings from Last 3 Encounters:  06/03/12 160 lb 12.8 oz (72.938 kg)  03/13/11 158 lb (71.668 kg)  05/11/10 163 lb 12.8 oz (74.299 kg)   Constitutional: She is appears well-developed and well-nourished. No distress.  HENT: Head: Normocephalic and atraumatic. Ears: wears hearing aid in left side because of progressive hereditary hearing loss -B TMs ok, no erythema or effusion; Nose: Nose normal. Mouth/Throat: Oropharynx is clear and moist. No oropharyngeal exudate.  Eyes: Conjunctivae and EOM are normal. Pupils are equal, round, and reactive to light. No scleral icterus.   Neck: Normal range of motion. Neck supple. No JVD present. No thyromegaly present.  Cardiovascular: Normal rate, regular rhythm and normal heart sounds.  No murmur heard. No BLE edema. Pulmonary/Chest: Effort normal and breath sounds normal. No respiratory distress. She has no wheezes.  Abdominal: Soft. Bowel sounds are normal. She exhibits no distension. There is no tenderness. no masses Musculoskeletal: Normal range of motion, no joint effusions. No gross deformities Neurological: She is alert and oriented to person, place, and time. No cranial nerve deficit. Coordination normal.  Skin: Skin is warm and dry. No rash noted. No erythema.  Psychiatric: She has a normal mood and affect. Her behavior is normal. Judgment and thought content normal.   Lab Results  Component Value Date   WBC 5.5 06/03/2012   HGB 14.2 06/03/2012   HCT 41.8 06/03/2012   PLT 318.0 06/03/2012   GLUCOSE 88 06/03/2012   CHOL 234* 06/03/2012   TRIG 79.0 06/03/2012   HDL 68.00 06/03/2012   LDLDIRECT 142.0 06/03/2012   LDLCALC 133 01/30/2011   ALT 15 06/03/2012   AST 19 06/03/2012   NA 139 06/03/2012   K 3.7 06/03/2012   CL 103 06/03/2012   CREATININE 0.7 06/03/2012   BUN 12 06/03/2012   CO2 28 06/03/2012   TSH 0.88 06/03/2012   ECG: Normal sinus at 72 beats per minute -no ischemic change     Assessment & Plan:  CPx- v70.0 - Patient has been counseled on age-appropriate routine health concerns for screening and prevention. These are reviewed and up-to-date. Immunizations are up-to-date or declined. Labs reviewed.

## 2012-06-03 NOTE — Patient Instructions (Signed)
It was good to see you today. We have reviewed your prior records including labs and tests today Health Maintenance reviewed - all recommended immunizations and age-appropriate screenings are up-to-date. Medications reviewed and updated - start low-dose Lipitor 20 mg daily for treatment of cholesterol Your prescription(s) have been submitted to your pharmacy. Please take as directed and contact our office if you believe you are having problem(s) with the medication(s). Return for labs only in 12 weeks to recheck cholesterol and liver function Please schedule followup in 12 months for annual physical/labs, call sooner if problems.  Health Maintenance, Females A healthy lifestyle and preventative care can promote health and wellness.  Maintain regular health, dental, and eye exams.  Eat a healthy diet. Foods like vegetables, fruits, whole grains, low-fat dairy products, and lean protein foods contain the nutrients you need without too many calories. Decrease your intake of foods high in solid fats, added sugars, and salt. Get information about a proper diet from your caregiver, if necessary.  Regular physical exercise is one of the most important things you can do for your health. Most adults should get at least 150 minutes of moderate-intensity exercise (any activity that increases your heart rate and causes you to sweat) each week. In addition, most adults need muscle-strengthening exercises on 2 or more days a week.   Maintain a healthy weight. The body mass index (BMI) is a screening tool to identify possible weight problems. It provides an estimate of body fat based on height and weight. Your caregiver can help determine your BMI, and can help you achieve or maintain a healthy weight. For adults 20 years and older:  A BMI below 18.5 is considered underweight.  A BMI of 18.5 to 24.9 is normal.  A BMI of 25 to 29.9 is considered overweight.  A BMI of 30 and above is considered  obese.  Maintain normal blood lipids and cholesterol by exercising and minimizing your intake of saturated fat. Eat a balanced diet with plenty of fruits and vegetables. Blood tests for lipids and cholesterol should begin at age 25 and be repeated every 5 years. If your lipid or cholesterol levels are high, you are over 50, or you are a high risk for heart disease, you may need your cholesterol levels checked more frequently.Ongoing high lipid and cholesterol levels should be treated with medicines if diet and exercise are not effective.  If you smoke, find out from your caregiver how to quit. If you do not use tobacco, do not start.  If you are pregnant, do not drink alcohol. If you are breastfeeding, be very cautious about drinking alcohol. If you are not pregnant and choose to drink alcohol, do not exceed 1 drink per day. One drink is considered to be 12 ounces (355 mL) of beer, 5 ounces (148 mL) of wine, or 1.5 ounces (44 mL) of liquor.  Avoid use of street drugs. Do not share needles with anyone. Ask for help if you need support or instructions about stopping the use of drugs.  High blood pressure causes heart disease and increases the risk of stroke. Blood pressure should be checked at least every 1 to 2 years. Ongoing high blood pressure should be treated with medicines, if weight loss and exercise are not effective.  If you are 67 to 52 years old, ask your caregiver if you should take aspirin to prevent strokes.  Diabetes screening involves taking a blood sample to check your fasting blood sugar level. This should be done  once every 3 years, after age 52, if you are within normal weight and without risk factors for diabetes. Testing should be considered at a younger age or be carried out more frequently if you are overweight and have at least 1 risk factor for diabetes.  Breast cancer screening is essential preventative care for women. You should practice "breast self-awareness." This means  understanding the normal appearance and feel of your breasts and may include breast self-examination. Any changes detected, no matter how small, should be reported to a caregiver. Women in their 48s and 30s should have a clinical breast exam (CBE) by a caregiver as part of a regular health exam every 1 to 3 years. After age 4, women should have a CBE every year. Starting at age 71, women should consider having a mammogram (breast X-ray) every year. Women who have a family history of breast cancer should talk to their caregiver about genetic screening. Women at a high risk of breast cancer should talk to their caregiver about having an MRI and a mammogram every year.  The Pap test is a screening test for cervical cancer. Women should have a Pap test starting at age 68. Between ages 83 and 49, Pap tests should be repeated every 2 years. Beginning at age 28, you should have a Pap test every 3 years as long as the past 3 Pap tests have been normal. If you had a hysterectomy for a problem that was not cancer or a condition that could lead to cancer, then you no longer need Pap tests. If you are between ages 48 and 47, and you have had normal Pap tests going back 10 years, you no longer need Pap tests. If you have had past treatment for cervical cancer or a condition that could lead to cancer, you need Pap tests and screening for cancer for at least 20 years after your treatment. If Pap tests have been discontinued, risk factors (such as a new sexual partner) need to be reassessed to determine if screening should be resumed. Some women have medical problems that increase the chance of getting cervical cancer. In these cases, your caregiver may recommend more frequent screening and Pap tests.  The human papillomavirus (HPV) test is an additional test that may be used for cervical cancer screening. The HPV test looks for the virus that can cause the cell changes on the cervix. The cells collected during the Pap test  can be tested for HPV. The HPV test could be used to screen women aged 4 years and older, and should be used in women of any age who have unclear Pap test results. After the age of 2, women should have HPV testing at the same frequency as a Pap test.  Colorectal cancer can be detected and often prevented. Most routine colorectal cancer screening begins at the age of 36 and continues through age 67. However, your caregiver may recommend screening at an earlier age if you have risk factors for colon cancer. On a yearly basis, your caregiver may provide home test kits to check for hidden blood in the stool. Use of a small camera at the end of a tube, to directly examine the colon (sigmoidoscopy or colonoscopy), can detect the earliest forms of colorectal cancer. Talk to your caregiver about this at age 78, when routine screening begins. Direct examination of the colon should be repeated every 5 to 10 years through age 21, unless early forms of pre-cancerous polyps or small growths are found.  Hepatitis C blood testing is recommended for all people born from [birth year] through 1965 and any individual with known risks for hepatitis C.  Practice safe sex. Use condoms and avoid high-risk sexual practices to reduce the spread of sexually transmitted infections (STIs). Sexually active women aged 25 and younger should be checked for Chlamydia, which is a common sexually transmitted infection. Older women with new or multiple partners should also be tested for Chlamydia. Testing for other STIs is recommended if you are sexually active and at increased risk.  Osteoporosis is a disease in which the bones lose minerals and strength with aging. This can result in serious bone fractures. The risk of osteoporosis can be identified using a bone density scan. Women ages 22 and over and women at risk for fractures or osteoporosis should discuss screening with their caregivers. Ask your caregiver whether you should be taking a  calcium supplement or vitamin D to reduce the rate of osteoporosis.  Menopause can be associated with physical symptoms and risks. Hormone replacement therapy is available to decrease symptoms and risks. You should talk to your caregiver about whether hormone replacement therapy is right for you.  Use sunscreen with a sun protection factor (SPF) of 30 or greater. Apply sunscreen liberally and repeatedly throughout the day. You should seek shade when your shadow is shorter than you. Protect yourself by wearing long sleeves, pants, a wide-brimmed hat, and sunglasses year round, whenever you are outdoors.  Notify your caregiver of new moles or changes in moles, especially if there is a change in shape or color. Also notify your caregiver if a mole is larger than the size of a pencil eraser.  Stay current with your immunizations. Document Released: 08/29/2010 Document Revised: 05/08/2011 Document Reviewed: 08/29/2010 [facility] Patient Information 2013 [place], [place]. Exercise to Lose Weight Exercise and a healthy diet may help you lose weight. Your doctor may suggest specific exercises. EXERCISE IDEAS AND TIPS  Choose low-cost things you enjoy doing, such as walking, bicycling, or exercising to workout videos.  Take stairs instead of the elevator.  Walk during your lunch break.  Park your car further away from work or school.  Go to a gym or an exercise class.  Start with 5 to 10 minutes of exercise each day. Build up to 30 minutes of exercise 4 to 6 days a week.  Wear shoes with good support and comfortable clothes.  Stretch before and after working out.  Work out until you breathe harder and your heart beats faster.  Drink extra water when you exercise.  Do not do so much that you hurt yourself, feel dizzy, or get very short of breath. Exercises that burn about 150 calories:  Running 1  miles in 15 minutes.  Playing volleyball for 45 to 60 minutes.  Washing and waxing a car  for 45 to 60 minutes.  Playing touch football for 45 minutes.  Walking 1  miles in 35 minutes.  Pushing a stroller 1  miles in 30 minutes.  Playing basketball for 30 minutes.  Raking leaves for 30 minutes.  Bicycling 5 miles in 30 minutes.  Walking 2 miles in 30 minutes.  Dancing for 30 minutes.  Shoveling snow for 15 minutes.  Swimming laps for 20 minutes.  Walking up stairs for 15 minutes.  Bicycling 4 miles in 15 minutes.  Gardening for 30 to 45 minutes.  Jumping rope for 15 minutes.  Washing windows or floors for 45 to 60 minutes. Document Released: 03/18/2010 Document Revised:  05/08/2011 Document Reviewed: 03/18/2010 [facility] Patient Information 2013 [place], [place].

## 2012-07-29 ENCOUNTER — Ambulatory Visit
Admission: RE | Admit: 2012-07-29 | Discharge: 2012-07-29 | Disposition: A | Discharge status: Home / Self Care | Payer: BC Managed Care – PPO | Source: Ambulatory Visit | Attending: Obstetrics and Gynecology | Admitting: Obstetrics and Gynecology

## 2012-07-29 DIAGNOSIS — Z1231 Encounter for screening mammogram for malignant neoplasm of breast: Secondary | ICD-10-CM

## 2012-10-18 ENCOUNTER — Ambulatory Visit (INDEPENDENT_AMBULATORY_CARE_PROVIDER_SITE_OTHER): Payer: BC Managed Care – PPO | Admitting: Internal Medicine

## 2012-10-18 ENCOUNTER — Encounter: Payer: Self-pay | Admitting: Internal Medicine

## 2012-10-18 VITALS — BP 122/84 | HR 82 | Temp 98.5°F

## 2012-10-18 DIAGNOSIS — E785 Hyperlipidemia, unspecified: Secondary | ICD-10-CM

## 2012-10-18 DIAGNOSIS — J029 Acute pharyngitis, unspecified: Secondary | ICD-10-CM

## 2012-10-18 DIAGNOSIS — J309 Allergic rhinitis, unspecified: Secondary | ICD-10-CM

## 2012-10-18 MED ORDER — FEXOFENADINE HCL 180 MG PO TABS: 180.0000 mg | ORAL_TABLET | Freq: Every day | ORAL | Status: DC

## 2012-10-18 MED ORDER — AZITHROMYCIN 250 MG PO TABS: ORAL_TABLET | ORAL | Status: DC

## 2012-10-18 NOTE — Patient Instructions (Signed)
It was good to see you today. Zpak and Allegra -  Your prescription(s) have been submitted to your pharmacy. Please take as directed and contact our office if you believe you are having problem(s) with the medication(s). Salt water gargle Check cholesterol when fasting (lab only)

## 2012-10-18 NOTE — Assessment & Plan Note (Signed)
History of same, rising trend despite control of weight and diet per patient report begin low-dose statin 05/2012 because of strong family history CAD -Atorvastatin 20 mg daily Reminded to recheck labs to monitor effect of tx, patient to call sooner if problems

## 2012-10-18 NOTE — Progress Notes (Signed)
  Subjective:    Patient ID: Gina Goodwin, female    DOB: [birth year], 52 y.o.   MRN: [id number]  Sore Throat  This is a new problem. The current episode started 1 to 4 weeks ago. The problem has been unchanged. Neither side of throat is experiencing more pain than the other. The maximum temperature recorded prior to her arrival was 100 - 100.9 F. The fever has been present for 3 to 4 days. The pain is moderate. Associated symptoms include congestion, coughing, a hoarse voice and swollen glands. Pertinent negatives include no diarrhea, drooling, headaches, neck pain, shortness of breath, stridor, trouble swallowing or vomiting. Strep: ? She has tried NSAIDs for the symptoms. The treatment provided mild relief.  Cough Pertinent negatives include no headaches or shortness of breath.   Past Medical History  Diagnosis Date  . Hyperlipidemia   . Psoriasis   . Hearing loss     wears aide on Left side  . Dyslipidemia      Review of Systems  HENT: Positive for congestion and hoarse voice. Negative for drooling, trouble swallowing and neck pain.   Respiratory: Positive for cough. Negative for shortness of breath and stridor.   Gastrointestinal: Negative for vomiting and diarrhea.  Neurological: Negative for headaches.       Objective:   Physical Exam BP 122/84  Pulse 82  Temp(Src) 98.5 F (36.9 C) (Oral)  SpO2 97% Wt Readings from Last 3 Encounters:  06/03/12 160 lb 12.8 oz (72.938 kg)  03/13/11 158 lb (71.668 kg)  05/11/10 163 lb 12.8 oz (74.299 kg)   Constitutional: She appears well-developed and well-nourished. No distress.  HENT: Head: Normocephalic and atraumatic. Ears: B TMs ok, no erythema or effusion; Nose: Nose normal. Mouth/Throat: Oropharynx with mild erythema with oropharyngeal exudate.  Eyes: Conjunctivae and EOM are normal. Pupils are equal, round, and reactive to light. No scleral icterus.  Neck: Normal range of motion. Neck supple. Mild LAD, RL. No JVD present. No  thyromegaly present.  Cardiovascular: Normal rate, regular rhythm and normal heart sounds.  No murmur heard. No BLE edema. Pulmonary/Chest: Effort normal and breath sounds normal. No respiratory distress. She has no wheezes.  Skin: Skin is warm and dry. No rash noted. No erythema.  Psychiatric: She has a normal mood and affect. Her behavior is normal. Judgment and thought content normal.   Lab Results  Component Value Date   WBC 5.5 06/03/2012   HGB 14.2 06/03/2012   HCT 41.8 06/03/2012   PLT 318.0 06/03/2012   GLUCOSE 88 06/03/2012   CHOL 234* 06/03/2012   TRIG 79.0 06/03/2012   HDL 68.00 06/03/2012   LDLDIRECT 142.0 06/03/2012   LDLCALC 133 01/30/2011   ALT 15 06/03/2012   AST 19 06/03/2012   NA 139 06/03/2012   K 3.7 06/03/2012   CL 103 06/03/2012   CREATININE 0.7 06/03/2012   BUN 12 06/03/2012   CO2 28 06/03/2012   TSH 0.88 06/03/2012       Assessment & Plan:   Acute pharyngitis, ongoing greater than 2 weeks. Exudative changes on exam with low grade fever. Empiric antibiotics prescribed, encouraged saltwater gargle -continue Tylenol or anti-inflammatory  Allergic rhinitis. Encouraged to resume oral antihistamine.

## 2012-11-20 ENCOUNTER — Other Ambulatory Visit: Payer: Self-pay | Admitting: Obstetrics and Gynecology

## 2012-11-20 DIAGNOSIS — R922 Inconclusive mammogram: Secondary | ICD-10-CM

## 2012-11-29 ENCOUNTER — Ambulatory Visit
Admission: RE | Admit: 2012-11-29 | Discharge: 2012-11-29 | Disposition: A | Discharge status: Home / Self Care | Payer: BC Managed Care – PPO | Source: Ambulatory Visit | Attending: Obstetrics and Gynecology | Admitting: Obstetrics and Gynecology

## 2012-11-29 ENCOUNTER — Other Ambulatory Visit: Payer: Self-pay | Admitting: Obstetrics and Gynecology

## 2012-11-29 DIAGNOSIS — R9230 Dense breasts, unspecified: Secondary | ICD-10-CM

## 2012-11-29 DIAGNOSIS — R922 Inconclusive mammogram: Secondary | ICD-10-CM

## 2012-12-23 ENCOUNTER — Other Ambulatory Visit: Payer: Self-pay | Admitting: *Deleted

## 2012-12-23 MED ORDER — ATORVASTATIN CALCIUM 20 MG PO TABS: 20.0000 mg | ORAL_TABLET | Freq: Every day | ORAL | Status: DC

## 2013-01-06 ENCOUNTER — Other Ambulatory Visit: Payer: Self-pay | Admitting: Obstetrics and Gynecology

## 2013-05-15 ENCOUNTER — Telehealth: Payer: Self-pay | Admitting: *Deleted

## 2013-05-15 ENCOUNTER — Other Ambulatory Visit: Payer: Self-pay | Admitting: *Deleted

## 2013-05-15 MED ORDER — CIPROFLOXACIN-DEXAMETHASONE 0.3-0.1 % OT SUSP: 4.0000 [drp] | Freq: Two times a day (BID) | OTIC | Status: DC

## 2013-05-15 NOTE — Telephone Encounter (Signed)
Left message on VM for pt to return call to [facility]. 

## 2013-05-15 NOTE — Telephone Encounter (Signed)
Pt called states her Audiologist states she needs a Rx for Ciprodex for infection.  Please advise in Dr Diamantina MonksLeschber's absence

## 2013-05-15 NOTE — Telephone Encounter (Signed)
Done erx 

## 2013-05-16 ENCOUNTER — Telehealth: Payer: Self-pay | Admitting: *Deleted

## 2013-05-16 NOTE — Telephone Encounter (Signed)
Pt left msg on vm returning someone call from yesterday. [facility] inform her that Dr. Jonny RuizJohn sent the ear drops to her pharmacy...Raechel Chute/lmb

## 2013-08-13 ENCOUNTER — Other Ambulatory Visit: Payer: Self-pay

## 2013-08-13 DIAGNOSIS — Z1231 Encounter for screening mammogram for malignant neoplasm of breast: Secondary | ICD-10-CM

## 2013-08-19 ENCOUNTER — Ambulatory Visit: Payer: BC Managed Care – PPO

## 2013-08-20 ENCOUNTER — Ambulatory Visit: Payer: BC Managed Care – PPO

## 2013-08-25 ENCOUNTER — Ambulatory Visit
Admission: RE | Admit: 2013-08-25 | Discharge: 2013-08-25 | Disposition: A | Discharge status: Home / Self Care | Payer: BC Managed Care – PPO | Source: Ambulatory Visit

## 2013-08-25 DIAGNOSIS — Z1231 Encounter for screening mammogram for malignant neoplasm of breast: Secondary | ICD-10-CM

## 2013-08-27 ENCOUNTER — Other Ambulatory Visit: Payer: Self-pay | Admitting: Internal Medicine

## 2014-02-09 ENCOUNTER — Other Ambulatory Visit: Payer: Self-pay | Admitting: Obstetrics and Gynecology

## 2014-02-10 LAB — CYTOLOGY - PAP

## 2014-04-16 ENCOUNTER — Other Ambulatory Visit: Payer: Self-pay

## 2014-04-16 DIAGNOSIS — Z1231 Encounter for screening mammogram for malignant neoplasm of breast: Secondary | ICD-10-CM

## 2014-04-27 ENCOUNTER — Telehealth: Payer: Self-pay | Admitting: Internal Medicine

## 2014-04-27 NOTE — Telephone Encounter (Signed)
Patient has been scheduled from dr Felicity Coyerleschber to have CPE done... She is requesting to have lab orders put in so that she can have labs done prior to visit.

## 2014-04-28 NOTE — Telephone Encounter (Signed)
Called patient to let her know that Dr. Dorise HissKollar will order labs during the office visit.

## 2014-05-05 ENCOUNTER — Encounter: Payer: BC Managed Care – PPO | Admitting: Internal Medicine

## 2014-05-05 ENCOUNTER — Encounter: Payer: Self-pay | Admitting: Internal Medicine

## 2014-05-12 ENCOUNTER — Ambulatory Visit (INDEPENDENT_AMBULATORY_CARE_PROVIDER_SITE_OTHER): Payer: 59 | Admitting: Internal Medicine

## 2014-05-12 ENCOUNTER — Encounter: Payer: Self-pay | Admitting: Internal Medicine

## 2014-05-12 ENCOUNTER — Other Ambulatory Visit (INDEPENDENT_AMBULATORY_CARE_PROVIDER_SITE_OTHER): Payer: 59

## 2014-05-12 VITALS — BP 118/76 | HR 68 | Temp 98.6°F | Resp 16 | Ht 65.0 in | Wt 163.0 lb

## 2014-05-12 DIAGNOSIS — Z0000 Encounter for general adult medical examination without abnormal findings: Secondary | ICD-10-CM

## 2014-05-12 DIAGNOSIS — R0789 Other chest pain: Secondary | ICD-10-CM

## 2014-05-12 DIAGNOSIS — E663 Overweight: Secondary | ICD-10-CM

## 2014-05-12 DIAGNOSIS — E785 Hyperlipidemia, unspecified: Secondary | ICD-10-CM

## 2014-05-12 LAB — COMPREHENSIVE METABOLIC PANEL
ALBUMIN: 4.5 g/dL (ref 3.5–5.2)
ALK PHOS: 82 U/L (ref 39–117)
ALT: 24 U/L (ref 0–35)
AST: 20 U/L (ref 0–37)
BUN: 12 mg/dL (ref 6–23)
CALCIUM: 9.5 mg/dL (ref 8.4–10.5)
CO2: 31 mEq/L (ref 19–32)
Chloride: 104 mEq/L (ref 96–112)
Creatinine, Ser: 0.66 mg/dL (ref 0.40–1.20)
GFR: 99.21 mL/min (ref >60.00)
Glucose, Bld: 97 mg/dL (ref 70–99)
POTASSIUM: 4.2 meq/L (ref 3.5–5.1)
SODIUM: 137 meq/L (ref 135–145)
Total Bilirubin: 0.4 mg/dL (ref 0.2–1.2)
Total Protein: 7.4 g/dL (ref 6.0–8.3)

## 2014-05-12 LAB — LIPID PANEL
CHOL/HDL RATIO: 3
Cholesterol: 215 mg/dL — ABNORMAL HIGH (ref 0–200)
HDL: 77.2 mg/dL (ref >39.00)
LDL Cholesterol: 116 mg/dL — ABNORMAL HIGH (ref 0–99)
NONHDL: 137.8
Triglycerides: 109 mg/dL (ref 0.0–149.0)
VLDL: 21.8 mg/dL (ref 0.0–40.0)

## 2014-05-12 LAB — HEMOGLOBIN A1C: Hgb A1c MFr Bld: 5.4 % (ref 4.6–6.5)

## 2014-05-12 MED ORDER — ATORVASTATIN CALCIUM 20 MG PO TABS: 20.0000 mg | ORAL_TABLET | Freq: Every day | ORAL | Status: DC

## 2014-05-12 NOTE — Progress Notes (Signed)
   Subjective:    Patient ID: Gina Goodwin, female    DOB: 1960/08/24, 54 y.o.   MRN: [id number]  HPI The patient is a 54 YO female who is coming in for wellness today. She does wish to switch providers as well due to lack of availability of her own PCP due to schedule change. She denies new problems or medical conditions since last visit. She is having 1-2 episodes of chest heaviness. She thinks that 1 of those episodes were while she is having stress. She denies any recurrence with exercise and she does vigorous exercise. She denies any pain associated with the episode of jaw discomfort. Denies diaphoresis. She has been taking cholesterol medicine and had not had the levels checked since then.   [facility] reviewed and updated during today's visit.   Review of Systems  Constitutional: Negative for fever, chills, activity change, appetite change, fatigue and unexpected weight change.  HENT: Negative.   Eyes: Negative.   Respiratory: Positive for chest tightness. Negative for cough, shortness of breath and wheezing.   Cardiovascular: Negative for chest pain, palpitations and leg swelling.       1 episode heaviness, see hpi  Gastrointestinal: Negative for nausea, abdominal pain, diarrhea, constipation and abdominal distention.  Musculoskeletal: Negative.   Skin: Negative.   Neurological: Negative.   Psychiatric/Behavioral: Positive for dysphoric mood.       Some increased stress in her life      Objective:   Physical Exam  Constitutional: She is oriented to person, place, and time. She appears well-developed and well-nourished.  Overweight  HENT:  Head: Normocephalic and atraumatic.  Eyes: EOM are normal.  Neck: Normal range of motion.  Cardiovascular: Normal rate and regular rhythm.   No murmur heard. Pulmonary/Chest: Effort normal and breath sounds normal. No respiratory distress. She has no wheezes.  Abdominal: Soft. Bowel sounds are normal.  Musculoskeletal:  She exhibits no edema.  Neurological: She is alert and oriented to person, place, and time. Coordination normal.  Skin: Skin is warm and dry.  Psychiatric: She has a normal mood and affect.   Filed Vitals:   05/12/14 0940  BP: 118/76  Pulse: 68  Temp: 98.6 F (37 C)  TempSrc: Oral  Resp: 16  Height: 5\' 5"  (1.651 m)  Weight: 163 lb (73.936 kg)  SpO2: 97%   EKG: Rate 60, normal rhythm, intervals normal, no st or t wave changes, no change from prior in 2014.      Assessment & Plan:

## 2014-05-12 NOTE — Progress Notes (Signed)
Pre visit review using our clinic review tool, if applicable. No additional management support is needed unless otherwise documented below in the visit note. 

## 2014-05-12 NOTE — Patient Instructions (Signed)
We have checked the EKG of the heart and it looks the same as 2 years ago. We will continue to watch and see if you have more episodes of the chest tightness. If you are continuing to have episodes it may be worthwhile to check a stress test for the heart.   We have checked your blood work today and will call you back about the results.   Come back in about 12 months for a physical. If you have any new problems or questions before then please feel free to call the office.  We will check the bone density scan are the time you turn 60. Keep up the good work with taking calcium and vitamin D and doing weight bearing exercise about 3 times per week.  Health Maintenance Adopting a healthy lifestyle and getting preventive care can go a long way to promote health and wellness. Talk with your health care provider about what schedule of regular examinations is right for you. This is a good chance for you to check in with your provider about disease prevention and staying healthy. In between checkups, there are plenty of things you can do on your own. Experts have done a lot of research about which lifestyle changes and preventive measures are most likely to keep you healthy. Ask your health care provider for more information. WEIGHT AND DIET  Eat a healthy diet  Be sure to include plenty of vegetables, fruits, low-fat dairy products, and lean protein.  Do not eat a lot of foods high in solid fats, added sugars, or salt.  Get regular exercise. This is one of the most important things you can do for your health.  Most adults should exercise for at least 150 minutes each week. The exercise should increase your heart rate and make you sweat (moderate-intensity exercise).  Most adults should also do strengthening exercises at least twice a week. This is in addition to the moderate-intensity exercise.  Maintain a healthy weight  Body mass index (BMI) is a measurement that can be used to identify possible  weight problems. It estimates body fat based on height and weight. Your health care provider can help determine your BMI and help you achieve or maintain a healthy weight.  For females 2 years of age and older:   A BMI below 18.5 is considered underweight.  A BMI of 18.5 to 24.9 is normal.  A BMI of 25 to 29.9 is considered overweight.  A BMI of 30 and above is considered obese.  Watch levels of cholesterol and blood lipids  You should start having your blood tested for lipids and cholesterol at 54 years of age, then have this test every 5 years.  You may need to have your cholesterol levels checked more often if:  Your lipid or cholesterol levels are high.  You are older than 54 years of age.  You are at high risk for heart disease.  CANCER SCREENING   Lung Cancer  Lung cancer screening is recommended for adults 92-3 years old who are at high risk for lung cancer because of a history of smoking.  A yearly low-dose CT scan of the lungs is recommended for people who:  Currently smoke.  Have quit within the past 15 years.  Have at least a 30-pack-year history of smoking. A pack year is smoking an average of one pack of cigarettes a day for 1 year.  Yearly screening should continue until it has been 15 years since you quit.  Yearly screening should stop if you develop a health problem that would prevent you from having lung cancer treatment.  Breast Cancer  Practice breast self-awareness. This means understanding how your breasts normally appear and feel.  It also means doing regular breast self-exams. Let your health care provider know about any changes, no matter how small.  If you are in your 20s or 30s, you should have a clinical breast exam (CBE) by a health care provider every 1-3 years as part of a regular health exam.  If you are 52 or older, have a CBE every year. Also consider having a breast X-ray (mammogram) every year.  If you have a family history of  breast cancer, talk to your health care provider about genetic screening.  If you are at high risk for breast cancer, talk to your health care provider about having an MRI and a mammogram every year.  Breast cancer gene (BRCA) assessment is recommended for women who have family members with BRCA-related cancers. BRCA-related cancers include:  Breast.  Ovarian.  Tubal.  Peritoneal cancers.  Results of the assessment will determine the need for genetic counseling and BRCA1 and BRCA2 testing. Cervical Cancer Routine pelvic examinations to screen for cervical cancer are no longer recommended for nonpregnant women who are considered low risk for cancer of the pelvic organs (ovaries, uterus, and vagina) and who do not have symptoms. A pelvic examination may be necessary if you have symptoms including those associated with pelvic infections. Ask your health care provider if a screening pelvic exam is right for you.   The Pap test is the screening test for cervical cancer for women who are considered at risk.  If you had a hysterectomy for a problem that was not cancer or a condition that could lead to cancer, then you no longer need Pap tests.  If you are older than 65 years, and you have had normal Pap tests for the past 10 years, you no longer need to have Pap tests.  If you have had past treatment for cervical cancer or a condition that could lead to cancer, you need Pap tests and screening for cancer for at least 20 years after your treatment.  If you no longer get a Pap test, assess your risk factors if they change (such as having a new sexual partner). This can affect whether you should start being screened again.  Some women have medical problems that increase their chance of getting cervical cancer. If this is the case for you, your health care provider may recommend more frequent screening and Pap tests.  The human papillomavirus (HPV) test is another test that may be used for  cervical cancer screening. The HPV test looks for the virus that can cause cell changes in the cervix. The cells collected during the Pap test can be tested for HPV.  The HPV test can be used to screen women 62 years of age and older. Getting tested for HPV can extend the interval between normal Pap tests from three to five years.  An HPV test also should be used to screen women of any age who have unclear Pap test results.  After 54 years of age, women should have HPV testing as often as Pap tests.  Colorectal Cancer  This type of cancer can be detected and often prevented.  Routine colorectal cancer screening usually begins at 54 years of age and continues through 54 years of age.  Your health care provider may recommend screening at  an earlier age if you have risk factors for colon cancer.  Your health care provider may also recommend using home test kits to check for hidden blood in the stool.  A small camera at the end of a tube can be used to examine your colon directly (sigmoidoscopy or colonoscopy). This is done to check for the earliest forms of colorectal cancer.  Routine screening usually begins at age 51.  Direct examination of the colon should be repeated every 5-10 years through 54 years of age. However, you may need to be screened more often if early forms of precancerous polyps or small growths are found. Skin Cancer  Check your skin from head to toe regularly.  Tell your health care provider about any new moles or changes in moles, especially if there is a change in a mole's shape or color.  Also tell your health care provider if you have a mole that is larger than the size of a pencil eraser.  Always use sunscreen. Apply sunscreen liberally and repeatedly throughout the day.  Protect yourself by wearing long sleeves, pants, a wide-brimmed hat, and sunglasses whenever you are outside. HEART DISEASE, DIABETES, AND HIGH BLOOD PRESSURE   Have your blood pressure  checked at least every 1-2 years. High blood pressure causes heart disease and increases the risk of stroke.  If you are between 49 years and 75 years old, ask your health care provider if you should take aspirin to prevent strokes.  Have regular diabetes screenings. This involves taking a blood sample to check your fasting blood sugar level.  If you are at a normal weight and have a low risk for diabetes, have this test once every three years after 54 years of age.  If you are overweight and have a high risk for diabetes, consider being tested at a younger age or more often. PREVENTING INFECTION  Hepatitis B  If you have a higher risk for hepatitis B, you should be screened for this virus. You are considered at high risk for hepatitis B if:  You were born in a country where hepatitis B is common. Ask your health care provider which countries are considered high risk.  Your parents were born in a high-risk country, and you have not been immunized against hepatitis B (hepatitis B vaccine).  You have HIV or AIDS.  You use needles to inject street drugs.  You live with someone who has hepatitis B.  You have had sex with someone who has hepatitis B.  You get hemodialysis treatment.  You take certain medicines for conditions, including cancer, organ transplantation, and autoimmune conditions. Hepatitis C  Blood testing is recommended for:  Everyone born from [birth year] through 1965.  Anyone with known risk factors for hepatitis C. Sexually transmitted infections (STIs)  You should be screened for sexually transmitted infections (STIs) including gonorrhea and chlamydia if:  You are sexually active and are younger than 54 years of age.  You are older than 54 years of age and your health care provider tells you that you are at risk for this type of infection.  Your sexual activity has changed since you were last screened and you are at an increased risk for chlamydia or gonorrhea. Ask  your health care provider if you are at risk.  If you do not have HIV, but are at risk, it may be recommended that you take a prescription medicine daily to prevent HIV infection. This is called pre-exposure prophylaxis (PrEP). You are considered at  risk if:  You are sexually active and do not regularly use condoms or know the HIV status of your partner(s).  You take drugs by injection.  You are sexually active with a partner who has HIV. Talk with your health care provider about whether you are at high risk of being infected with HIV. If you choose to begin PrEP, you should first be tested for HIV. You should then be tested every 3 months for as long as you are taking PrEP.  PREGNANCY   If you are premenopausal and you may become pregnant, ask your health care provider about preconception counseling.  If you may become pregnant, take 400 to 800 micrograms (mcg) of folic acid every day.  If you want to prevent pregnancy, talk to your health care provider about birth control (contraception). OSTEOPOROSIS AND MENOPAUSE   Osteoporosis is a disease in which the bones lose minerals and strength with aging. This can result in serious bone fractures. Your risk for osteoporosis can be identified using a bone density scan.  If you are 34 years of age or older, or if you are at risk for osteoporosis and fractures, ask your health care provider if you should be screened.  Ask your health care provider whether you should take a calcium or vitamin D supplement to lower your risk for osteoporosis.  Menopause may have certain physical symptoms and risks.  Hormone replacement therapy may reduce some of these symptoms and risks. Talk to your health care provider about whether hormone replacement therapy is right for you.  HOME CARE INSTRUCTIONS   Schedule regular health, dental, and eye exams.  Stay current with your immunizations.   Do not use any tobacco products including cigarettes, chewing  tobacco, or electronic cigarettes.  If you are pregnant, do not drink alcohol.  If you are breastfeeding, limit how much and how often you drink alcohol.  Limit alcohol intake to no more than 1 drink per day for nonpregnant women. One drink equals 12 ounces of beer, 5 ounces of wine, or 1 ounces of hard liquor.  Do not use street drugs.  Do not share needles.  Ask your health care provider for help if you need support or information about quitting drugs.  Tell your health care provider if you often feel depressed.  Tell your health care provider if you have ever been abused or do not feel safe at home. Document Released: 08/29/2010 Document Revised: 06/30/2013 Document Reviewed: 01/15/2013 [facility] Patient Information 2015 [place], [place]. This information is not intended to replace advice given to you by your health care provider. Make sure you discuss any questions you have with your health care provider.

## 2014-05-14 DIAGNOSIS — R0789 Other chest pain: Secondary | ICD-10-CM | POA: Insufficient documentation

## 2014-05-14 HISTORY — DX: Other chest pain: R07.89

## 2014-05-14 NOTE — Assessment & Plan Note (Signed)
She is doing good with exercise and the increased stress in her life has caused her to go back to food some. Encouraged her to keep up the good work.

## 2014-05-14 NOTE — Assessment & Plan Note (Signed)
EKG normal today, low risk for cardiac disease. If recurrences will refer to cardiology for stress test.

## 2014-05-14 NOTE — Assessment & Plan Note (Signed)
Started on medication about 1.5 years ago and never returned for follow up labs. Check lipid panel today and adjust regimen as needed.

## 2014-08-27 ENCOUNTER — Ambulatory Visit: Payer: Self-pay

## 2014-09-30 ENCOUNTER — Ambulatory Visit: Payer: Self-pay

## 2014-11-09 ENCOUNTER — Ambulatory Visit
Admission: RE | Admit: 2014-11-09 | Discharge: 2014-11-09 | Disposition: A | Discharge status: Home / Self Care | Payer: BLUE CROSS/BLUE SHIELD | Source: Ambulatory Visit

## 2014-11-09 DIAGNOSIS — Z1231 Encounter for screening mammogram for malignant neoplasm of breast: Secondary | ICD-10-CM

## 2014-11-11 ENCOUNTER — Other Ambulatory Visit: Payer: Self-pay | Admitting: Obstetrics and Gynecology

## 2014-11-11 DIAGNOSIS — R928 Other abnormal and inconclusive findings on diagnostic imaging of breast: Secondary | ICD-10-CM

## 2014-11-19 ENCOUNTER — Ambulatory Visit
Admission: RE | Admit: 2014-11-19 | Discharge: 2014-11-19 | Disposition: A | Discharge status: Home / Self Care | Payer: BLUE CROSS/BLUE SHIELD | Source: Ambulatory Visit | Attending: Obstetrics and Gynecology | Admitting: Obstetrics and Gynecology

## 2014-11-19 DIAGNOSIS — R928 Other abnormal and inconclusive findings on diagnostic imaging of breast: Secondary | ICD-10-CM

## 2015-01-29 ENCOUNTER — Telehealth: Payer: Self-pay | Admitting: *Deleted

## 2015-01-29 NOTE — Telephone Encounter (Signed)
Called pt no answer LMOM with md response.../lmb 

## 2015-01-29 NOTE — Telephone Encounter (Signed)
Left msg on triage stating she is needing a refill for the ciprodex ear drops. Is this ok to refill...Raechel Chute/lmb

## 2015-01-29 NOTE — Telephone Encounter (Signed)
Shouldn't be refilled without visit to look at her ears.

## 2015-04-23 ENCOUNTER — Other Ambulatory Visit: Payer: Self-pay | Admitting: *Deleted

## 2015-04-23 MED ORDER — ATORVASTATIN CALCIUM 20 MG PO TABS: 20.0000 mg | ORAL_TABLET | Freq: Every day | ORAL | Status: DC

## 2015-04-23 NOTE — Telephone Encounter (Signed)
Left msg on triage yesterday afternoon stating she need refill on her Lipitor sent to [facility], Pt has scheudle CPX 3/17, called back no answer LMOM 30 day sent to [facility] until apt...Raechel Chute

## 2015-05-13 ENCOUNTER — Encounter: Payer: 59 | Admitting: Internal Medicine

## 2015-05-30 ENCOUNTER — Other Ambulatory Visit: Payer: Self-pay | Admitting: Internal Medicine

## 2015-07-15 ENCOUNTER — Ambulatory Visit (INDEPENDENT_AMBULATORY_CARE_PROVIDER_SITE_OTHER): Payer: BLUE CROSS/BLUE SHIELD | Admitting: Family

## 2015-07-15 ENCOUNTER — Ambulatory Visit: Payer: BLUE CROSS/BLUE SHIELD | Admitting: Family

## 2015-07-15 ENCOUNTER — Encounter: Payer: Self-pay | Admitting: Family

## 2015-07-15 VITALS — BP 130/90 | HR 67 | Temp 97.8°F | Ht 65.0 in | Wt 168.5 lb

## 2015-07-15 DIAGNOSIS — R030 Elevated blood-pressure reading, without diagnosis of hypertension: Secondary | ICD-10-CM

## 2015-07-15 DIAGNOSIS — IMO0001 Reserved for inherently not codable concepts without codable children: Secondary | ICD-10-CM

## 2015-07-15 NOTE — Progress Notes (Signed)
Pre visit review using our clinic review tool, if applicable. No additional management support is needed unless otherwise documented below in the visit note. 

## 2015-07-15 NOTE — Patient Instructions (Addendum)
Get some walks in and watch weight. Limit sodium.   Reschedule CPE.  If there is no improvement in your symptoms, or if there is any worsening of symptoms, or if you have any additional concerns, please return for re-evaluation; or, if we are closed, consider going to the Emergency Room for evaluation if symptoms urgent.  Hypertension Hypertension, commonly called high blood pressure, is when the force of blood pumping through your arteries is too strong. Your arteries are the blood vessels that carry blood from your heart throughout your body. A blood pressure reading consists of a higher number over a lower number, such as 110/72. The higher number (systolic) is the pressure inside your arteries when your heart pumps. The lower number (diastolic) is the pressure inside your arteries when your heart relaxes. Ideally you want your blood pressure below 120/80. Hypertension forces your heart to work harder to pump blood. Your arteries may become narrow or stiff. Having untreated or uncontrolled hypertension can cause heart attack, stroke, kidney disease, and other problems. RISK FACTORS Some risk factors for high blood pressure are controllable. Others are not.  Risk factors you cannot control include:   Race. You may be at higher risk if you are African American.  Age. Risk increases with age.  Gender. Men are at higher risk than women before age 66 years. After age 19, women are at higher risk than men. Risk factors you can control include:  Not getting enough exercise or physical activity.  Being overweight.  Getting too much fat, sugar, calories, or salt in your diet.  Drinking too much alcohol. SIGNS AND SYMPTOMS Hypertension does not usually cause signs or symptoms. Extremely high blood pressure (hypertensive crisis) may cause headache, anxiety, shortness of breath, and nosebleed. DIAGNOSIS To check if you have hypertension, your health care provider will measure your blood pressure  while you are seated, with your arm held at the level of your heart. It should be measured at least twice using the same arm. Certain conditions can cause a difference in blood pressure between your right and left arms. A blood pressure reading that is higher than normal on one occasion does not mean that you need treatment. If it is not clear whether you have high blood pressure, you may be asked to return on a different day to have your blood pressure checked again. Or, you may be asked to monitor your blood pressure at home for 1 or more weeks. TREATMENT Treating high blood pressure includes making lifestyle changes and possibly taking medicine. Living a healthy lifestyle can help lower high blood pressure. You may need to change some of your habits. Lifestyle changes may include:  Following the DASH diet. This diet is high in fruits, vegetables, and whole grains. It is low in salt, red meat, and added sugars.  Keep your sodium intake below 2,300 mg per day.  Getting at least 30-45 minutes of aerobic exercise at least 4 times per week.  Losing weight if necessary.  Not smoking.  Limiting alcoholic beverages.  Learning ways to reduce stress. Your health care provider may prescribe medicine if lifestyle changes are not enough to get your blood pressure under control, and if one of the following is true:  You are 28-19 years of age and your systolic blood pressure is above 140.  You are 68 years of age or older, and your systolic blood pressure is above 150.  Your diastolic blood pressure is above 90.  You have diabetes, and your  systolic blood pressure is over 140 or your diastolic blood pressure is over 90.  You have kidney disease and your blood pressure is above 140/90.  You have heart disease and your blood pressure is above 140/90. Your personal target blood pressure may vary depending on your medical conditions, your age, and other factors. HOME CARE INSTRUCTIONS  Have your  blood pressure rechecked as directed by your health care provider.   Take medicines only as directed by your health care provider. Follow the directions carefully. Blood pressure medicines must be taken as prescribed. The medicine does not work as well when you skip doses. Skipping doses also puts you at risk for problems.  Do not smoke.   Monitor your blood pressure at home as directed by your health care provider. SEEK MEDICAL CARE IF:   You think you are having a reaction to medicines taken.  You have recurrent headaches or feel dizzy.  You have swelling in your ankles.  You have trouble with your vision. SEEK IMMEDIATE MEDICAL CARE IF:  You develop a severe headache or confusion.  You have unusual weakness, numbness, or feel faint.  You have severe chest or abdominal pain.  You vomit repeatedly.  You have trouble breathing. MAKE SURE YOU:   Understand these instructions.  Will watch your condition.  Will get help right away if you are not doing well or get worse.   This information is not intended to replace advice given to you by your health care provider. Make sure you discuss any questions you have with your health care provider.   Document Released: 02/13/2005 Document Revised: 06/30/2014 Document Reviewed: 12/06/2012 [employer] Interactive Patient Education [employer]2016 [employer]

## 2015-07-15 NOTE — Progress Notes (Signed)
Subjective:    Patient ID: Gina Goodwin, female    DOB: 04/10/1960, 55 y.o.   MRN: [id number]   Gina Goodwin is a 55 y.o. female who presents today for an acute visit.    HPI Comments: Patient has noticed that her BP has been elevated more than normal. At [facility] few days ago BP 130/103, retook her BP and it was 128/87. Patient states that she has been under some stress lately and believes that may have contributed to BP.  Believes over last two months stress related to slight increase in BP. She also notes she needs to work out more and eat healthier.  Denies exertional chest pain or pressure, numbness or tingling radiating to left arm or jaw, palpitations, dizziness, frequent headaches, changes in vision, or shortness of breath.   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   Past Medical History  Diagnosis Date  . Hyperlipidemia   . Psoriasis   . Hearing loss     wears aide on Left side  . Dyslipidemia    Allergies: Review of patient's allergies indicates no known allergies. Current Outpatient Prescriptions on File Prior to Visit  Medication Sig Dispense Refill  . ALPRAZolam (XANAX) 0.5 MG tablet Take 0.5 mg by mouth at bedtime as needed. Per GYN - Dr Erlinda HongMc Comb    . atorvastatin (LIPITOR) 20 MG tablet TAKE 1 TABLET (20 MG TOTAL) BY MOUTH DAILY. MUST KEEP 05/10/15 APPT FOR FUTURE REFILLS (Patient taking differently: 3 times a week) 30 tablet 0  . Calcium Carbonate-Vitamin D (CALTRATE 600+D) 600-400 MG-UNIT per tablet Take 1 tablet by mouth daily.    . fexofenadine (ALLEGRA) 180 MG tablet Take 1 tablet (180 mg total) by mouth daily. 30 tablet 3   No current facility-administered medications on file prior to visit.    Social History  Substance Use Topics  . Smoking status: Never Smoker   . Smokeless tobacco: None  . Alcohol Use: Yes     Comment: Occasional     Review of Systems  Constitutional: Negative for fever and chills.  Eyes: Negative for visual disturbance.  Respiratory: Negative for  cough.   Cardiovascular: Negative for chest pain, palpitations and leg swelling.  Gastrointestinal: Negative for nausea and vomiting.  Neurological: Negative for dizziness and headaches.      Objective:    BP 130/90 mmHg  Pulse 67  Temp(Src) 97.8 F (36.6 C) (Oral)  Ht 5\' 5"  (1.651 m)  Wt 168 lb 8 oz (76.431 kg)  BMI 28.04 kg/m2  SpO2 97%  LMP 07/14/2012 (Within Months)   Physical Exam  Constitutional: She appears well-developed and well-nourished.  Eyes: Conjunctivae are normal.  Cardiovascular: Normal rate, regular rhythm, normal heart sounds and normal pulses.   Pulmonary/Chest: Effort normal and breath sounds normal. She has no wheezes. She has no rhonchi. She has no rales.  Neurological: She is alert.  Skin: Skin is warm and dry.  Psychiatric: She has a normal mood and affect. Her speech is normal and behavior is normal. Thought content normal.  Vitals reviewed.      Assessment & Plan:   1. Elevated blood pressure Normotensive. Patient does not have any signs or symptoms of hypertensive urgency or emergency at this time. Advised lifestyle modifications as per chart review, I can see that blood pressure is elevated above baseline. Patient agreed that stress, poor diet, lack of exercise may all be contributing and stated that she would work on those things.    I have discontinued  Ms. Needs phentermine and ciprofloxacin-dexamethasone. I am also having her maintain her ALPRAZolam, Calcium Carbonate-Vitamin D, fexofenadine, and atorvastatin.   No orders of the defined types were placed in this encounter.     Start medications as prescribed and explained to patient on After Visit Summary ( AVS). Risks, benefits, and alternatives of the medications and treatment plan prescribed today were discussed, and patient expressed understanding.   Education regarding symptom management and diagnosis given to patient.   Follow-up:Plan follow-up as discussed or as needed if  any worsening symptoms or change in condition.   Continue to follow with Myrlene Broker, MD for routine health maintenance.   Gina Sessions and I agreed with plan.   Rennie Plowman, FNP

## 2015-08-04 ENCOUNTER — Other Ambulatory Visit: Payer: Self-pay | Admitting: Internal Medicine

## 2015-10-28 ENCOUNTER — Ambulatory Visit (INDEPENDENT_AMBULATORY_CARE_PROVIDER_SITE_OTHER): Payer: BLUE CROSS/BLUE SHIELD | Admitting: Internal Medicine

## 2015-10-28 ENCOUNTER — Encounter: Payer: Self-pay | Admitting: Internal Medicine

## 2015-10-28 DIAGNOSIS — L729 Follicular cyst of the skin and subcutaneous tissue, unspecified: Secondary | ICD-10-CM | POA: Diagnosis not present

## 2015-10-28 HISTORY — DX: Follicular cyst of the skin and subcutaneous tissue, unspecified: L72.9

## 2015-10-28 NOTE — Patient Instructions (Signed)
Watch the area and get the mammogram in September.

## 2015-10-28 NOTE — Progress Notes (Signed)
Pre visit review using our clinic review tool, if applicable. No additional management support is needed unless otherwise documented below in the visit note. 

## 2015-10-28 NOTE — Assessment & Plan Note (Signed)
No tenderness and not abscess. Mobile and circular. Most likely cyst. She will get mammogram in September and call us back if this grows or does not resolve.

## 2015-10-28 NOTE — Progress Notes (Signed)
   Subjective:    Patient ID: Gina Goodwin, female    DOB: [birth year], 55 y.o.   MRN: [id number]  HPI The patient is a 55 YO female coming in for boil on her chest for about 2 weeks. Denies pain in the area. Denies recent cold or flu. It is not tender. She is up to date on mammogram and about due for her yearly follow up. Denies any breast lumps or bumps.   Review of Systems  Constitutional: Negative.  Negative for unexpected weight change.  Respiratory: Negative.   Cardiovascular: Negative.   Gastrointestinal: Negative.   Musculoskeletal: Negative.   Skin: Positive for color change.  Neurological: Negative.       Objective:   Physical Exam  Constitutional: She appears well-developed and well-nourished.  HENT:  Head: Normocephalic and atraumatic.  Eyes: EOM are normal.  Neck: Normal range of motion. Neck supple. No JVD present. No thyromegaly present.  Cardiovascular: Normal rate and regular rhythm.   Pulmonary/Chest: Effort normal and breath sounds normal. No respiratory distress. She has no wheezes.  Abdominal: Soft.  Lymphadenopathy:    She has no cervical adenopathy.  Skin: Skin is warm and dry.  1 cm cyst on the right chest wall, no LAD along the clavicle or cervical region, no breast lumps.    Vitals:   10/28/15 1428  BP: 128/78  Pulse: 75  Resp: 12  Temp: 98.4 F (36.9 C)  TempSrc: Oral  SpO2: 98%  Weight: 169 lb 1.9 oz (76.7 kg)  Height: 5\' 5"  (1.651 m)      Assessment & Plan:

## 2015-11-03 ENCOUNTER — Encounter: Payer: BLUE CROSS/BLUE SHIELD | Admitting: Internal Medicine

## 2016-01-10 ENCOUNTER — Other Ambulatory Visit: Payer: Self-pay | Admitting: Obstetrics and Gynecology

## 2016-01-10 DIAGNOSIS — Z1231 Encounter for screening mammogram for malignant neoplasm of breast: Secondary | ICD-10-CM

## 2016-02-14 ENCOUNTER — Ambulatory Visit: Payer: BLUE CROSS/BLUE SHIELD

## 2016-02-15 ENCOUNTER — Ambulatory Visit
Admission: RE | Admit: 2016-02-15 | Discharge: 2016-02-15 | Disposition: A | Discharge status: Home / Self Care | Payer: BLUE CROSS/BLUE SHIELD | Source: Ambulatory Visit | Attending: Obstetrics and Gynecology | Admitting: Obstetrics and Gynecology

## 2016-02-15 DIAGNOSIS — Z1231 Encounter for screening mammogram for malignant neoplasm of breast: Secondary | ICD-10-CM

## 2016-02-23 DIAGNOSIS — Z01419 Encounter for gynecological examination (general) (routine) without abnormal findings: Secondary | ICD-10-CM | POA: Diagnosis not present

## 2016-02-23 DIAGNOSIS — Z6828 Body mass index (BMI) 28.0-28.9, adult: Secondary | ICD-10-CM | POA: Diagnosis not present

## 2016-02-23 DIAGNOSIS — N760 Acute vaginitis: Secondary | ICD-10-CM | POA: Diagnosis not present

## 2016-03-21 DIAGNOSIS — D2272 Melanocytic nevi of left lower limb, including hip: Secondary | ICD-10-CM | POA: Diagnosis not present

## 2016-03-21 DIAGNOSIS — L570 Actinic keratosis: Secondary | ICD-10-CM | POA: Diagnosis not present

## 2016-03-21 DIAGNOSIS — L814 Other melanin hyperpigmentation: Secondary | ICD-10-CM | POA: Diagnosis not present

## 2016-03-21 DIAGNOSIS — Z808 Family history of malignant neoplasm of other organs or systems: Secondary | ICD-10-CM | POA: Diagnosis not present

## 2016-04-11 DIAGNOSIS — D123 Benign neoplasm of transverse colon: Secondary | ICD-10-CM | POA: Diagnosis not present

## 2016-04-11 DIAGNOSIS — D126 Benign neoplasm of colon, unspecified: Secondary | ICD-10-CM | POA: Diagnosis not present

## 2016-04-11 DIAGNOSIS — Z1211 Encounter for screening for malignant neoplasm of colon: Secondary | ICD-10-CM | POA: Diagnosis not present

## 2016-05-09 ENCOUNTER — Telehealth: Payer: Self-pay | Admitting: Internal Medicine

## 2016-05-09 NOTE — Telephone Encounter (Signed)
Pt called request referral to allergy doctor. Please call her back.

## 2016-05-09 NOTE — Telephone Encounter (Signed)
She should not need referral, can just call and schedule visit

## 2016-05-10 NOTE — Telephone Encounter (Signed)
Patient contacted and stated awareness 

## 2016-07-07 ENCOUNTER — Ambulatory Visit (INDEPENDENT_AMBULATORY_CARE_PROVIDER_SITE_OTHER): Payer: BLUE CROSS/BLUE SHIELD | Admitting: Internal Medicine

## 2016-07-07 ENCOUNTER — Ambulatory Visit: Payer: BLUE CROSS/BLUE SHIELD | Admitting: Internal Medicine

## 2016-07-07 ENCOUNTER — Encounter: Payer: Self-pay | Admitting: Internal Medicine

## 2016-07-07 VITALS — BP 142/90 | HR 100 | Temp 98.5°F | Resp 12 | Ht 65.0 in | Wt 166.0 lb

## 2016-07-07 DIAGNOSIS — Z0000 Encounter for general adult medical examination without abnormal findings: Secondary | ICD-10-CM

## 2016-07-07 DIAGNOSIS — F4322 Adjustment disorder with anxiety: Secondary | ICD-10-CM

## 2016-07-07 HISTORY — DX: Adjustment disorder with anxiety: F43.22

## 2016-07-07 MED ORDER — ALPRAZOLAM 0.5 MG PO TABS: 0.5000 mg | ORAL_TABLET | Freq: Every day | ORAL | 0 refills | Status: DC | PRN

## 2016-07-07 MED ORDER — ESCITALOPRAM OXALATE 10 MG PO TABS: 10.0000 mg | ORAL_TABLET | Freq: Every day | ORAL | 2 refills | Status: DC
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
## 2016-07-07 NOTE — Patient Instructions (Signed)
We will send in the lexapro to take 1 pill daily.   We have sent in the refill of the xanax to the pharmacy as well.   Come back in 1-2 months to check in on the blood pressure and the mood.    Stress and Stress Management Stress is a normal reaction to life events. It is what you feel when life demands more than you are used to or more than you can handle. Some stress can be useful. For example, the stress reaction can help you catch the last bus of the day, study for a test, or meet a deadline at work. But stress that occurs too often or for too long can cause problems. It can affect your emotional health and interfere with relationships and normal daily activities. Too much stress can weaken your immune system and increase your risk for physical illness. If you already have a medical problem, stress can make it worse. What are the causes? All sorts of life events may cause stress. An event that causes stress for one person may not be stressful for another person. Major life events commonly cause stress. These may be positive or negative. Examples include losing your job, moving into a new home, getting married, having a baby, or losing a loved one. Less obvious life events may also cause stress, especially if they occur day after day or in combination. Examples include working long hours, driving in traffic, caring for children, being in debt, or being in a difficult relationship. What are the signs or symptoms? Stress may cause emotional symptoms including, the following:  Anxiety. This is feeling worried, afraid, on edge, overwhelmed, or out of control.  Anger. This is feeling irritated or impatient.  Depression. This is feeling sad, down, helpless, or guilty.  Difficulty focusing, remembering, or making decisions. Stress may cause physical symptoms, including the following:  Aches and pains. These may affect your head, neck, back, stomach, or other areas of your body.  Tight muscles or  clenched jaw.  Low energy or trouble sleeping. Stress may cause unhealthy behaviors, including the following:  Eating to feel better (overeating) or skipping meals.  Sleeping too little, too much, or both.  Working too much or putting off tasks (procrastination).  Smoking, drinking alcohol, or using drugs to feel better. How is this diagnosed? Stress is diagnosed through an assessment by your health care provider. Your health care provider will ask questions about your symptoms and any stressful life events.Your health care provider will also ask about your medical history and may order blood tests or other tests. Certain medical conditions and medicine can cause physical symptoms similar to stress. Mental illness can cause emotional symptoms and unhealthy behaviors similar to stress. Your health care provider may refer you to a mental health professional for further evaluation. How is this treated? Stress management is the recommended treatment for stress.The goals of stress management are reducing stressful life events and coping with stress in healthy ways. Techniques for reducing stressful life events include the following:  Stress identification. Self-monitor for stress and identify what causes stress for you. These skills may help you to avoid some stressful events.  Time management. Set your priorities, keep a calendar of events, and learn to say "no." These tools can help you avoid making too many commitments. Techniques for coping with stress include the following:  Rethinking the problem. Try to think realistically about stressful events rather than ignoring them or overreacting. Try to find the positives in  a stressful situation rather than focusing on the negatives.  Exercise. Physical exercise can release both physical and emotional tension. The key is to find a form of exercise you enjoy and do it regularly.  Relaxation techniques. These relax the body and mind. Examples  include yoga, meditation, tai chi, biofeedback, deep breathing, progressive muscle relaxation, listening to music, being out in nature, journaling, and other hobbies. Again, the key is to find one or more that you enjoy and can do regularly.  Healthy lifestyle. Eat a balanced diet, get plenty of sleep, and do not smoke. Avoid using alcohol or drugs to relax.  Strong support network. Spend time with family, friends, or other people you enjoy being around.Express your feelings and talk things over with someone you trust. Counseling or talktherapy with a mental health professional may be helpful if you are having difficulty managing stress on your own. Medicine is typically not recommended for the treatment of stress.Talk to your health care provider if you think you need medicine for symptoms of stress. Follow these instructions at home:  Keep all follow-up visits as directed by your health care provider.  Take all medicines as directed by your health care provider. Contact a health care provider if:  Your symptoms get worse or you start having new symptoms.  You feel overwhelmed by your problems and can no longer manage them on your own. Get help right away if:  You feel like hurting yourself or someone else. This information is not intended to replace advice given to you by your health care provider. Make sure you discuss any questions you have with your health care provider. Document Released: 08/09/2000 Document Revised: 07/22/2015 Document Reviewed: 10/08/2012 Elsevier Interactive Patient Education  2017 [employer].
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
## 2016-07-14 ENCOUNTER — Other Ambulatory Visit (INDEPENDENT_AMBULATORY_CARE_PROVIDER_SITE_OTHER): Payer: BLUE CROSS/BLUE SHIELD

## 2016-07-14 DIAGNOSIS — Z0000 Encounter for general adult medical examination without abnormal findings: Secondary | ICD-10-CM

## 2016-07-14 LAB — COMPREHENSIVE METABOLIC PANEL
ALT: 27 U/L (ref 0–35)
AST: 22 U/L (ref 0–37)
Albumin: 4.7 g/dL (ref 3.5–5.2)
Alkaline Phosphatase: 106 U/L (ref 39–117)
BUN: 14 mg/dL (ref 6–23)
CHLORIDE: 103 meq/L (ref 96–112)
CO2: 28 meq/L (ref 19–32)
CREATININE: 0.67 mg/dL (ref 0.40–1.20)
Calcium: 10.1 mg/dL (ref 8.4–10.5)
GFR: 96.72 mL/min (ref >60.00)
Glucose, Bld: 103 mg/dL — ABNORMAL HIGH (ref 70–99)
POTASSIUM: 4.6 meq/L (ref 3.5–5.1)
SODIUM: 140 meq/L (ref 135–145)
Total Bilirubin: 0.6 mg/dL (ref 0.2–1.2)
Total Protein: 7.8 g/dL (ref 6.0–8.3)

## 2016-07-14 LAB — LIPID PANEL
Cholesterol: 210 mg/dL — ABNORMAL HIGH (ref 0–200)
HDL: 72.7 mg/dL (ref >39.00)
LDL CALC: 120 mg/dL — AB (ref 0–99)
NonHDL: 137.45
Total CHOL/HDL Ratio: 3
Triglycerides: 89 mg/dL (ref 0.0–149.0)
VLDL: 17.8 mg/dL (ref 0.0–40.0)

## 2016-07-14 LAB — CBC
HEMATOCRIT: 42.4 % (ref 36.0–46.0)
HEMOGLOBIN: 14.1 g/dL (ref 12.0–15.0)
MCHC: 33.2 g/dL (ref 30.0–36.0)
MCV: 88.5 fl (ref 78.0–100.0)
Platelets: 332 10*3/uL (ref 150.0–400.0)
RBC: 4.79 Mil/uL (ref 3.87–5.11)
RDW: 13.8 % (ref 11.5–15.5)
WBC: 6.8 10*3/uL (ref 4.0–10.5)

## 2016-07-14 LAB — VITAMIN D 25 HYDROXY (VIT D DEFICIENCY, FRACTURES): VITD: 39.83 ng/mL (ref 30.00–100.00)

## 2016-07-14 LAB — HEMOGLOBIN A1C: Hgb A1c MFr Bld: 5.6 % (ref 4.6–6.5)

## 2016-07-14 LAB — T4, FREE: FREE T4: 0.87 ng/dL (ref 0.60–1.60)

## 2016-07-14 LAB — TSH: TSH: 0.99 u[IU]/mL (ref 0.35–4.50)

## 2016-08-09 ENCOUNTER — Ambulatory Visit: Payer: BLUE CROSS/BLUE SHIELD | Admitting: Internal Medicine

## 2016-08-23 ENCOUNTER — Encounter: Payer: Self-pay | Admitting: Nurse Practitioner

## 2016-08-23 ENCOUNTER — Ambulatory Visit (INDEPENDENT_AMBULATORY_CARE_PROVIDER_SITE_OTHER): Payer: BLUE CROSS/BLUE SHIELD | Admitting: Nurse Practitioner

## 2016-08-23 VITALS — BP 126/84 | HR 70 | Temp 98.0°F | Ht 65.0 in | Wt 167.0 lb

## 2016-08-23 DIAGNOSIS — F4322 Adjustment disorder with anxiety: Secondary | ICD-10-CM

## 2016-08-23 DIAGNOSIS — H66002 Acute suppurative otitis media without spontaneous rupture of ear drum, left ear: Secondary | ICD-10-CM

## 2016-08-23 DIAGNOSIS — E782 Mixed hyperlipidemia: Secondary | ICD-10-CM

## 2016-08-23 MED ORDER — CIPROFLOXACIN-DEXAMETHASONE 0.3-0.1 % OT SUSP: 4.0000 [drp] | Freq: Two times a day (BID) | OTIC | 0 refills | Status: DC

## 2016-08-23 MED ORDER — ESCITALOPRAM OXALATE 10 MG PO TABS: 10.0000 mg | ORAL_TABLET | Freq: Every day | ORAL | 3 refills | Status: DC

## 2016-08-23 MED ORDER — ATORVASTATIN CALCIUM 20 MG PO TABS: 20.0000 mg | ORAL_TABLET | Freq: Every day | ORAL | 3 refills | Status: DC

## 2016-08-23 NOTE — Progress Notes (Signed)
Subjective:  Patient ID: Gina SessionsKathleen F Laskowski, female    DOB: 1960-12-03  Age: 56 y.o. MRN: [id number]  CC: Follow-up (1 mo f/u--medication for ear infection consult--refill for lipitor)  Otalgia   There is pain in the left ear. This is a recurrent problem. The current episode started in the past 7 days. The problem occurs constantly. The problem has been unchanged. There has been no fever. Associated symptoms include ear discharge and hearing loss. Pertinent negatives include no abdominal pain, coughing, diarrhea, headaches, neck pain, rash, rhinorrhea, sore throat or vomiting. She has tried nothing for the symptoms. Her past medical history is significant for a chronic ear infection and hearing loss. There is no history of a tympanostomy tube.   Anxiety: Improved with current dose of lexapro.  Hyperlipidemia: Denies any adverse effects.  Outpatient Medications Prior to Visit  Medication Sig Dispense Refill  . ALPRAZolam (XANAX) 0.5 MG tablet Take 1 tablet (0.5 mg total) by mouth daily as needed. Per GYN - Dr Erlinda HongMc Comb 15 tablet 0  . Calcium Carbonate-Vitamin D (CALTRATE 600+D) 600-400 MG-UNIT per tablet Take 1 tablet by mouth daily.    . fexofenadine (ALLEGRA) 180 MG tablet Take 1 tablet (180 mg total) by mouth daily. 30 tablet 3  . atorvastatin (LIPITOR) 20 MG tablet TAKE 1 TABLET (20 MG TOTAL) BY MOUTH DAILY. MUST KEEP 05/10/15 APPT FOR FUTURE REFILLS (Patient taking differently: 3 times a week) 30 tablet 0  . atorvastatin (LIPITOR) 20 MG tablet TAKE 1 TABLET BY MOUTH EVERY DAY 90 tablet 2  . escitalopram (LEXAPRO) 10 MG tablet Take 1 tablet (10 mg total) by mouth daily. 30 tablet 2   No facility-administered medications prior to visit.     ROS See HPI  Objective:  BP 126/84   Pulse 70   Temp 98 F (36.7 C)   Ht 5\' 5"  (1.651 m)   Wt 167 lb (75.8 kg)   SpO2 98%   BMI 27.79 kg/m   BP Readings from Last 3 Encounters:  08/23/16 126/84  07/07/16 (!) 142/90  10/28/15 128/78     Wt Readings from Last 3 Encounters:  08/23/16 167 lb (75.8 kg)  07/07/16 166 lb (75.3 kg)  10/28/15 169 lb 1.9 oz (76.7 kg)    Physical Exam  Constitutional: She is oriented to person, place, and time. No distress.  HENT:  Right Ear: External ear normal. No drainage or tenderness. No mastoid tenderness. Tympanic membrane is not injected and not erythematous. No middle ear effusion.  Left Ear: There is drainage. No tenderness. No mastoid tenderness. Tympanic membrane is not injected and not erythematous.  No middle ear effusion.  Nose: Nose normal.  Mouth/Throat: Oropharynx is clear and moist. No oropharyngeal exudate.  Neck: Normal range of motion. Neck supple. No thyromegaly present.  Cardiovascular: Normal rate, regular rhythm and normal heart sounds.   Pulmonary/Chest: Effort normal and breath sounds normal.  Musculoskeletal: She exhibits no edema.  Lymphadenopathy:    She has no cervical adenopathy.  Neurological: She is alert and oriented to person, place, and time.  Vitals reviewed.   Lab Results  Component Value Date   WBC 6.8 07/14/2016   HGB 14.1 07/14/2016   HCT 42.4 07/14/2016   PLT 332.0 07/14/2016   GLUCOSE 103 (H) 07/14/2016   CHOL 210 (H) 07/14/2016   TRIG 89.0 07/14/2016   HDL 72.70 07/14/2016   LDLDIRECT 142.0 06/03/2012   LDLCALC 120 (H) 07/14/2016   ALT 27 07/14/2016   AST 22  07/14/2016   NA 140 07/14/2016   K 4.6 07/14/2016   CL 103 07/14/2016   CREATININE 0.67 07/14/2016   BUN 14 07/14/2016   CO2 28 07/14/2016   TSH 0.99 07/14/2016   HGBA1C 5.6 07/14/2016    Mm Screening Breast Tomo Bilateral  Result Date: 02/15/2016 CLINICAL DATA:  Screening. EXAM: 2D DIGITAL SCREENING BILATERAL MAMMOGRAM WITH CAD AND ADJUNCT TOMO COMPARISON:  Previous exam(s). ACR Breast Density Category c: The breast tissue is heterogeneously dense, which may obscure small masses. FINDINGS: There are no findings suspicious for malignancy. Images were processed with CAD.  IMPRESSION: No mammographic evidence of malignancy. A result letter of this screening mammogram will be mailed directly to the patient. RECOMMENDATION: Screening mammogram in one year. (Code:SM-B-01Y) BI-RADS CATEGORY  1: Negative. Electronically Signed   By: Amie Portland M.D.   On: 02/16/2016 15:03    Assessment & Plan:   Kaislyn was seen today for follow-up.  Diagnoses and all orders for this visit:  Adjustment disorder with anxious mood -     escitalopram (LEXAPRO) 10 MG tablet; Take 1 tablet (10 mg total) by mouth daily.  Mixed hyperlipidemia -     atorvastatin (LIPITOR) 20 MG tablet; Take 1 tablet (20 mg total) by mouth daily.  Acute suppurative otitis media of left ear without spontaneous rupture of tympanic membrane, recurrence not specified -     ciprofloxacin-dexamethasone (CIPRODEX) OTIC suspension; Place 4 drops into the left ear 2 (two) times daily.   I have discontinued Ms. Tucker's atorvastatin. I have also changed her atorvastatin. Additionally, I am having her start on ciprofloxacin-dexamethasone. Lastly, I am having her maintain her Calcium Carbonate-Vitamin D, fexofenadine, ALPRAZolam, and escitalopram.  Meds ordered this encounter  Medications  . ciprofloxacin-dexamethasone (CIPRODEX) OTIC suspension    Sig: Place 4 drops into the left ear 2 (two) times daily.    Dispense:  7.5 mL    Refill:  0    Order Specific Question:   Supervising Provider    Answer:   Tresa Garter [[id number]]  . atorvastatin (LIPITOR) 20 MG tablet    Sig: Take 1 tablet (20 mg total) by mouth daily.    Dispense:  90 tablet    Refill:  3    Order Specific Question:   Supervising Provider    Answer:   Tresa Garter [[id number]]  . escitalopram (LEXAPRO) 10 MG tablet    Sig: Take 1 tablet (10 mg total) by mouth daily.    Dispense:  90 tablet    Refill:  3    Order Specific Question:   Supervising Provider    Answer:   Tresa Garter [[id number]]    Follow-up: Return in about 6  months (around 02/22/2017) for hyperlipidemia and anxiety with Dr. Okey Dupre.  Alysia Penna, NP

## 2016-08-23 NOTE — Patient Instructions (Signed)
Otitis Media, Adult Otitis media is redness, soreness, and puffiness (swelling) in the space just behind your eardrum (middle ear). It may be caused by allergies or infection. It often happens along with a cold. Follow these instructions at home:  Take your medicine as told. Finish it even if you start to feel better.  Only take over-the-counter or prescription medicines for pain, discomfort, or fever as told by your doctor.  Follow up with your doctor as told. Contact a doctor if:  You have otitis media only in one ear, or bleeding from your nose, or both.  You notice a lump on your neck.  You are not getting better in 3-5 days.  You feel worse instead of better. Get help right away if:  You have pain that is not helped with medicine.  You have puffiness, redness, or pain around your ear.  You get a stiff neck.  You cannot move part of your face (paralysis).  You notice that the bone behind your ear hurts when you touch it. This information is not intended to replace advice given to you by your health care provider. Make sure you discuss any questions you have with your health care provider. Document Released: 08/02/2007 Document Revised: 07/22/2015 Document Reviewed: 09/10/2012 Elsevier Interactive Patient Education  2017 [employer]  

## 2016-10-05 DIAGNOSIS — L700 Acne vulgaris: Secondary | ICD-10-CM | POA: Diagnosis not present

## 2016-10-05 DIAGNOSIS — L570 Actinic keratosis: Secondary | ICD-10-CM | POA: Diagnosis not present

## 2016-11-06 ENCOUNTER — Other Ambulatory Visit: Payer: Self-pay | Admitting: Obstetrics and Gynecology

## 2016-11-06 DIAGNOSIS — Z1231 Encounter for screening mammogram for malignant neoplasm of breast: Secondary | ICD-10-CM

## 2016-12-29 ENCOUNTER — Telehealth: Payer: Self-pay | Admitting: Internal Medicine

## 2016-12-29 NOTE — Telephone Encounter (Signed)
Pt called in and said that she had labs done a couple days ago and they are in epic, she said her cholesterol is still high on the meds.  What should she do?    Best number [phone]

## 2017-01-01 NOTE — Telephone Encounter (Signed)
I do not see any labs in the computer, where did she have them done. She can also bring a copy for visit and we can discuss cholesterol.

## 2017-01-01 NOTE — Telephone Encounter (Signed)
Called patient she states that the labs must have not been put in that she will call back to make an appointment and bring labs with her.

## 2017-01-10 DIAGNOSIS — N858 Other specified noninflammatory disorders of uterus: Secondary | ICD-10-CM | POA: Diagnosis not present

## 2017-01-11 DIAGNOSIS — N950 Postmenopausal bleeding: Secondary | ICD-10-CM | POA: Diagnosis not present

## 2017-02-16 ENCOUNTER — Ambulatory Visit
Admission: RE | Admit: 2017-02-16 | Discharge: 2017-02-16 | Disposition: A | Discharge status: Home / Self Care | Payer: BLUE CROSS/BLUE SHIELD | Source: Ambulatory Visit | Attending: Obstetrics and Gynecology | Admitting: Obstetrics and Gynecology

## 2017-02-16 DIAGNOSIS — Z1231 Encounter for screening mammogram for malignant neoplasm of breast: Secondary | ICD-10-CM

## 2017-02-26 DIAGNOSIS — Z6827 Body mass index (BMI) 27.0-27.9, adult: Secondary | ICD-10-CM | POA: Diagnosis not present

## 2017-02-26 DIAGNOSIS — Z01419 Encounter for gynecological examination (general) (routine) without abnormal findings: Secondary | ICD-10-CM | POA: Diagnosis not present

## 2017-05-10 ENCOUNTER — Telehealth: Payer: Self-pay | Admitting: Internal Medicine

## 2017-05-10 NOTE — Telephone Encounter (Signed)
[facility], patient scheduled to see Malva Coganody Martin @ 10am

## 2017-05-10 NOTE — Telephone Encounter (Signed)
Copied from CRM [id number]. Topic: Quick Communication - See Telephone Encounter >> May 10, 2017 10:30 AM Jolayne Hainesaylor, Brittany L wrote: CRM for notification. See Telephone encounter for:   05/10/17.  Patient said that she see's an audiologist and she can not prescribe antibiotic's and told her she has an irritated ear and that she needed to call her PCP for a prescription called [facility]  [id number] - [place], [place] - [address]  Call back if needed [phone]

## 2017-05-10 NOTE — Telephone Encounter (Addendum)
Spoke with Gina Goodwin and she states that the pt has not been sen in almost a year, and this request was not a result of an audiologist consult, therefore the pt needs an appointment; will notify patient; attempted to reach the pt without success; left message on voice mail [phone]..Marland Kitchen

## 2017-05-11 ENCOUNTER — Encounter: Payer: Self-pay | Admitting: Physician Assistant

## 2017-05-11 ENCOUNTER — Ambulatory Visit (INDEPENDENT_AMBULATORY_CARE_PROVIDER_SITE_OTHER): Payer: BLUE CROSS/BLUE SHIELD | Admitting: Physician Assistant

## 2017-05-11 VITALS — BP 129/88 | HR 85 | Temp 98.4°F | Resp 16 | Ht 65.0 in | Wt 167.1 lb

## 2017-05-11 DIAGNOSIS — H608X2 Other otitis externa, left ear: Secondary | ICD-10-CM

## 2017-05-11 MED ORDER — FLUOCINOLONE ACETONIDE 0.01 % OT OIL: 5.0000 [drp] | TOPICAL_OIL | Freq: Two times a day (BID) | OTIC | 0 refills | Status: DC

## 2017-05-11 NOTE — Progress Notes (Signed)
Patient presents to clinic today c/o flare--up of flaking and pruritus of L ear. Patient with chronic hearing loss, wears a hearing aid in the ear. Notes chronic flaking of skin that sometimes becomes intensely pruritic. Denies ear pain, drainage or tinnitus. Denies symptoms of R ear.   Past Medical History:  Diagnosis Date  . Dyslipidemia   . Hearing loss    wears aide on Left side  . Hyperlipidemia   . Psoriasis     Current Outpatient Medications on File Prior to Visit  Medication Sig Dispense Refill  . ALPRAZolam (XANAX) 0.5 MG tablet Take 1 tablet (0.5 mg total) by mouth daily as needed. Per GYN - Dr Erlinda HongMc Comb 15 tablet 0  . atorvastatin (LIPITOR) 20 MG tablet Take 1 tablet (20 mg total) by mouth daily. 90 tablet 3  . Calcium Carbonate-Vitamin D (CALTRATE 600+D) 600-400 MG-UNIT per tablet Take 1 tablet by mouth daily.    Marland Kitchen. escitalopram (LEXAPRO) 10 MG tablet Take 1 tablet (10 mg total) by mouth daily. 90 tablet 3  . fexofenadine (ALLEGRA) 180 MG tablet Take 1 tablet (180 mg total) by mouth daily. 30 tablet 3  . ciprofloxacin-dexamethasone (CIPRODEX) OTIC suspension Place 4 drops into the left ear 2 (two) times daily. (Patient not taking: Reported on 05/11/2017) 7.5 mL 0   No current facility-administered medications on file prior to visit.     No Known Allergies  Family History  Problem Relation Age of Onset  . Hyperlipidemia Father   . Heart disease Father   . Breast cancer Unknown        Breast - 1st degree relative <50    Social History   Socioeconomic History  . Marital status: Divorced    Spouse name: None  . Number of children: None  . Years of education: None  . Highest education level: None  Social Needs  . Financial resource strain: None  . Food insecurity - worry: None  . Food insecurity - inability: None  . Transportation needs - medical: None  . Transportation needs - non-medical: None  Occupational History  . None  Tobacco Use  . Smoking status:  Never Smoker  . Smokeless tobacco: Never Used  Substance and Sexual Activity  . Alcohol use: Yes    Comment: Occasional   . Drug use: None  . Sexual activity: None  Other Topics Concern  . None  Social History Narrative   Employed in [employer] at [employer]. Divorced, 3 children -   Review of Systems - See HPI.  All other ROS are negative.  BP 129/88 (BP Location: Left Arm, Patient Position: Sitting, Cuff Size: Normal)   Pulse 85   Temp 98.4 F (36.9 C) (Oral)   Resp 16   Ht 5\' 5"  (1.651 m)   Wt 167 lb 2 oz (75.8 kg)   SpO2 98%   BMI 27.81 kg/m   Physical Exam  Constitutional: She is oriented to person, place, and time and well-developed, well-nourished, and in no distress.  HENT:  Head: Normocephalic and atraumatic.  Left Ear: Tympanic membrane and external ear normal.  Canal with eczematous scaling noted.  Cardiovascular: Normal rate, regular rhythm and normal heart sounds.  Pulmonary/Chest: Effort normal.  Neurological: She is alert and oriented to person, place, and time.  Skin: Skin is warm and dry. No rash noted.  Psychiatric: Affect normal.  Vitals reviewed.  Assessment/Plan: 1. Chronic eczematoid otitis externa of left ear Start Dermotic BID x 7-10 days. Then PRN  sparingly. Supportive measures discussed.   - Fluocinolone Acetonide 0.01 % OIL; Place 5 drops in ear(s) 2 (two) times daily. For 7-10 days.  Dispense: 20 mL; Refill: 0   Piedad Climes, PA-C

## 2017-05-11 NOTE — Patient Instructions (Signed)
Please use the hydrogen peroxide once weekly to flush out ears and break up dead skin cells/wax.  Use the Dermotic drops as directed over the next 7-10 days. Then can use PRN as needed for bad itch in the ears.   Follow-up if symptoms are not resolving.

## 2017-06-18 ENCOUNTER — Other Ambulatory Visit: Payer: Self-pay | Admitting: Internal Medicine

## 2017-06-18 NOTE — Telephone Encounter (Signed)
Control database checked last refill: 07/07/2016 LOV: 08/23/2016

## 2017-07-11 ENCOUNTER — Ambulatory Visit: Payer: BLUE CROSS/BLUE SHIELD | Admitting: Internal Medicine

## 2017-07-11 ENCOUNTER — Encounter: Payer: Self-pay | Admitting: Internal Medicine

## 2017-07-11 DIAGNOSIS — R059 Cough, unspecified: Secondary | ICD-10-CM | POA: Insufficient documentation

## 2017-07-11 DIAGNOSIS — R05 Cough: Secondary | ICD-10-CM

## 2017-07-11 HISTORY — DX: Cough, unspecified: R05.9

## 2017-07-11 MED ORDER — BENZONATATE 200 MG PO CAPS: 200.0000 mg | ORAL_CAPSULE | Freq: Three times a day (TID) | ORAL | 0 refills | Status: DC | PRN

## 2017-07-11 MED ORDER — DOXYCYCLINE HYCLATE 100 MG PO TABS: 100.0000 mg | ORAL_TABLET | Freq: Two times a day (BID) | ORAL | 0 refills | Status: DC

## 2017-07-11 MED ORDER — HYDROCODONE-HOMATROPINE 5-1.5 MG/5ML PO SYRP: 5.0000 mL | ORAL_SOLUTION | Freq: Every evening | ORAL | 0 refills | Status: DC | PRN

## 2017-07-11 NOTE — Progress Notes (Signed)
   Subjective:    Patient ID: Gina Goodwin, female    DOB: Jul 25, 1960, 57 y.o.   MRN: [id number]  HPI The patient is a 57 YO female coming in for cough. Going on for about 1 month or so. She is taking allergy medicine and just started mucinex. She is using advil for headaches. She has had some swollen lymph nodes recently which caused her to make this visit. Cough is mostly non-productive. Overall worsening. She is having chills in the last day or so. She is taking xyzal for allergies.   Review of Systems  Constitutional: Positive for activity change, appetite change and chills. Negative for fatigue, fever and unexpected weight change.  HENT: Positive for congestion, postnasal drip, rhinorrhea and sinus pressure. Negative for ear discharge, ear pain, sinus pain, sneezing, sore throat, tinnitus, trouble swallowing and voice change.   Eyes: Negative.   Respiratory: Positive for cough. Negative for chest tightness, shortness of breath and wheezing.   Cardiovascular: Negative.   Gastrointestinal: Negative.   Musculoskeletal: Positive for myalgias.  Neurological: Negative.       Objective:   Physical Exam  Constitutional: She is oriented to person, place, and time. She appears well-developed and well-nourished.  HENT:  Head: Normocephalic and atraumatic.  Oropharynx with redness and clear drainage, nose with swollen turbinates, TMs normal bilaterally  Eyes: EOM are normal.  Neck: Normal range of motion. No thyromegaly present.  Cardiovascular: Normal rate and regular rhythm.  Pulmonary/Chest: Effort normal and breath sounds normal. No respiratory distress. She has no wheezes. She has no rales.  Abdominal: Soft.  Musculoskeletal: She exhibits tenderness.  Lymphadenopathy:    She has no cervical adenopathy.  Neurological: She is alert and oriented to person, place, and time.  Skin: Skin is warm and dry.   Vitals:   07/11/17 1003  BP: 100/70  Pulse: 83  Temp: 97.9 F (36.6 C)    TempSrc: Oral  SpO2: 96%  Weight: 167 lb (75.8 kg)  Height:  (1.651 m)      Assessment & Plan:

## 2017-07-11 NOTE — Assessment & Plan Note (Signed)
Rx for doxycycline, hycodan, tessalon perles. Will continue xyzal. [place] narcotic database reviewed and no inappropriate fills.

## 2017-07-11 NOTE — Patient Instructions (Signed)
We have sent in doxycycline to take 1 pill twice a day for 1 week.   We have sent in the cough syrup to use at night time.   We have sent in tessalon perles to use for coughing during the day up to 3 times per day.

## 2017-07-16 ENCOUNTER — Telehealth: Payer: Self-pay | Admitting: Internal Medicine

## 2017-07-16 NOTE — Telephone Encounter (Signed)
Yes she has couple pills left,  She is not feeling better but worse,  Coughing has increased,  She states she has taken  Prednisone in past and that works

## 2017-07-16 NOTE — Telephone Encounter (Signed)
Patient states that she is not seen much improvement since last OV from taking antibiotic prescribed.  Patient is requesting something else to be sent into [facility] [address].

## 2017-07-16 NOTE — Telephone Encounter (Signed)
She should still have 2 days of antibiotics yet. Is she feeling any better? Coughing more or less? Would recommend to finish antibiotic before any changes would be made.

## 2017-07-17 ENCOUNTER — Telehealth: Payer: Self-pay | Admitting: Internal Medicine

## 2017-07-17 NOTE — Telephone Encounter (Signed)
Copied from CRM [id number]. Topic: Quick Communication - See Telephone Encounter >> Jul 17, 2017 12:41 PM Rudi Coco, NT wrote: CRM for notification. See Telephone encounter for: 07/17/17.  Pt. Calling to request prednisone pt. Stated that she called on yesterday and no one has called her back with any information   [id number] [place], [place] - [address] AT [address] OF [facility] [address] [place] [address] Phone: [phone] Fax: [phone]

## 2017-07-17 NOTE — Telephone Encounter (Signed)
LVM informing patient of MD response  

## 2017-07-17 NOTE — Telephone Encounter (Signed)
Please see prior phone note, this is duplicative. We advised patient to finish meds prior to any changes. She should still have a day of medicine left.

## 2017-08-01 DIAGNOSIS — L814 Other melanin hyperpigmentation: Secondary | ICD-10-CM | POA: Diagnosis not present

## 2017-08-01 DIAGNOSIS — D2371 Other benign neoplasm of skin of right lower limb, including hip: Secondary | ICD-10-CM | POA: Diagnosis not present

## 2017-08-01 DIAGNOSIS — L570 Actinic keratosis: Secondary | ICD-10-CM | POA: Diagnosis not present

## 2017-08-01 DIAGNOSIS — L309 Dermatitis, unspecified: Secondary | ICD-10-CM | POA: Diagnosis not present

## 2017-08-01 DIAGNOSIS — L821 Other seborrheic keratosis: Secondary | ICD-10-CM | POA: Diagnosis not present

## 2017-10-25 ENCOUNTER — Other Ambulatory Visit: Payer: Self-pay | Admitting: Obstetrics and Gynecology

## 2017-10-25 DIAGNOSIS — Z1231 Encounter for screening mammogram for malignant neoplasm of breast: Secondary | ICD-10-CM

## 2017-11-01 ENCOUNTER — Telehealth: Payer: Self-pay | Admitting: Internal Medicine

## 2017-11-01 NOTE — Telephone Encounter (Signed)
Copied from CRM [id number]. Topic: Quick Communication - Rx Refill/Question >> Nov 01, 2017  1:19 PM Laural Benes, [place] C wrote: Medication: phentermine 37.5 MG capsule  Has the patient contacted their pharmacy? Yes  (Agent: If no, request that the patient contact the pharmacy for the refill.) (Agent: If yes, when and what did the pharmacy advise?  Preferred Pharmacy (with phone number or street name): [id number] - [place], [place] - [address] [facility] [address] [phone] (Phone) [phone] (Fax)    Agent: Please be advised that RX refills may take up to 3 business days. We ask that you follow-up with your pharmacy.

## 2017-11-01 NOTE — Telephone Encounter (Signed)
This is not on her current medication list and I cannot fill as I have not seen patient in more than 1 year. Advise to make visit if she is wanting to talk about this medication.

## 2017-11-01 NOTE — Telephone Encounter (Signed)
LVM for patient to call back to make appt, patient can make CPE and to dicuss med refills

## 2017-11-01 NOTE — Telephone Encounter (Signed)
Can you please make patient an office visit. Thank you  

## 2017-11-05 ENCOUNTER — Other Ambulatory Visit: Payer: Self-pay | Admitting: Nurse Practitioner

## 2017-11-05 DIAGNOSIS — F4322 Adjustment disorder with anxiety: Secondary | ICD-10-CM

## 2017-11-05 DIAGNOSIS — E782 Mixed hyperlipidemia: Secondary | ICD-10-CM

## 2017-11-12 ENCOUNTER — Other Ambulatory Visit: Payer: Self-pay | Admitting: Internal Medicine

## 2017-11-12 DIAGNOSIS — F4322 Adjustment disorder with anxiety: Secondary | ICD-10-CM

## 2018-01-09 ENCOUNTER — Encounter: Payer: Self-pay | Admitting: Internal Medicine

## 2018-01-09 ENCOUNTER — Ambulatory Visit (INDEPENDENT_AMBULATORY_CARE_PROVIDER_SITE_OTHER): Payer: BLUE CROSS/BLUE SHIELD | Admitting: Internal Medicine

## 2018-01-09 VITALS — BP 122/82 | HR 86 | Temp 98.0°F | Ht 65.0 in | Wt 162.0 lb

## 2018-01-09 DIAGNOSIS — F4322 Adjustment disorder with anxiety: Secondary | ICD-10-CM

## 2018-01-09 DIAGNOSIS — E782 Mixed hyperlipidemia: Secondary | ICD-10-CM

## 2018-01-09 DIAGNOSIS — Z0000 Encounter for general adult medical examination without abnormal findings: Secondary | ICD-10-CM | POA: Diagnosis not present

## 2018-01-09 MED ORDER — ATORVASTATIN CALCIUM 20 MG PO TABS: 20.0000 mg | ORAL_TABLET | Freq: Every day | ORAL | 3 refills | Status: DC

## 2018-01-09 MED ORDER — ESCITALOPRAM OXALATE 10 MG PO TABS: 10.0000 mg | ORAL_TABLET | Freq: Every day | ORAL | 3 refills | Status: DC

## 2018-01-09 NOTE — Patient Instructions (Addendum)
Think about getting the shingles vaccine if you want. This is 2 shots given 2 months apart.   Health Maintenance, Female Adopting a healthy lifestyle and getting preventive care can go a long way to promote health and wellness. Talk with your health care provider about what schedule of regular examinations is right for you. This is a good chance for you to check in with your provider about disease prevention and staying healthy. In between checkups, there are plenty of things you can do on your own. Experts have done a lot of research about which lifestyle changes and preventive measures are most likely to keep you healthy. Ask your health care provider for more information. Weight and diet Eat a healthy diet  Be sure to include plenty of vegetables, fruits, low-fat dairy products, and lean protein.  Do not eat a lot of foods high in solid fats, added sugars, or salt.  Get regular exercise. This is one of the most important things you can do for your health. ? Most adults should exercise for at least 150 minutes each week. The exercise should increase your heart rate and make you sweat (moderate-intensity exercise). ? Most adults should also do strengthening exercises at least twice a week. This is in addition to the moderate-intensity exercise.  Maintain a healthy weight  Body mass index (BMI) is a measurement that can be used to identify possible weight problems. It estimates body fat based on height and weight. Your health care provider can help determine your BMI and help you achieve or maintain a healthy weight.  For females 52 years of age and older: ? A BMI below 18.5 is considered underweight. ? A BMI of 18.5 to 24.9 is normal. ? A BMI of 25 to 29.9 is considered overweight. ? A BMI of 30 and above is considered obese.  Watch levels of cholesterol and blood lipids  You should start having your blood tested for lipids and cholesterol at 57 years of age, then have this test every 5  years.  You may need to have your cholesterol levels checked more often if: ? Your lipid or cholesterol levels are high. ? You are older than 57 years of age. ? You are at high risk for heart disease.  Cancer screening Lung Cancer  Lung cancer screening is recommended for adults 65-68 years old who are at high risk for lung cancer because of a history of smoking.  A yearly low-dose CT scan of the lungs is recommended for people who: ? Currently smoke. ? Have quit within the past 15 years. ? Have at least a 30-pack-year history of smoking. A pack year is smoking an average of one pack of cigarettes a day for 1 year.  Yearly screening should continue until it has been 15 years since you quit.  Yearly screening should stop if you develop a health problem that would prevent you from having lung cancer treatment.  Breast Cancer  Practice breast self-awareness. This means understanding how your breasts normally appear and feel.  It also means doing regular breast self-exams. Let your health care provider know about any changes, no matter how small.  If you are in your 20s or 30s, you should have a clinical breast exam (CBE) by a health care provider every 1-3 years as part of a regular health exam.  If you are 67 or older, have a CBE every year. Also consider having a breast X-ray (mammogram) every year.  If you have a family history  of breast cancer, talk to your health care provider about genetic screening.  If you are at high risk for breast cancer, talk to your health care provider about having an MRI and a mammogram every year.  Breast cancer gene (BRCA) assessment is recommended for women who have family members with BRCA-related cancers. BRCA-related cancers include: ? Breast. ? Ovarian. ? Tubal. ? Peritoneal cancers.  Results of the assessment will determine the need for genetic counseling and BRCA1 and BRCA2 testing.  Cervical Cancer Your health care provider may  recommend that you be screened regularly for cancer of the pelvic organs (ovaries, uterus, and vagina). This screening involves a pelvic examination, including checking for microscopic changes to the surface of your cervix (Pap test). You may be encouraged to have this screening done every 3 years, beginning at age 21.  For women ages 30-65, health care providers may recommend pelvic exams and Pap testing every 3 years, or they may recommend the Pap and pelvic exam, combined with testing for human papilloma virus (HPV), every 5 years. Some types of HPV increase your risk of cervical cancer. Testing for HPV may also be done on women of any age with unclear Pap test results.  Other health care providers may not recommend any screening for nonpregnant women who are considered low risk for pelvic cancer and who do not have symptoms. Ask your health care provider if a screening pelvic exam is right for you.  If you have had past treatment for cervical cancer or a condition that could lead to cancer, you need Pap tests and screening for cancer for at least 20 years after your treatment. If Pap tests have been discontinued, your risk factors (such as having a new sexual partner) need to be reassessed to determine if screening should resume. Some women have medical problems that increase the chance of getting cervical cancer. In these cases, your health care provider may recommend more frequent screening and Pap tests.  Colorectal Cancer  This type of cancer can be detected and often prevented.  Routine colorectal cancer screening usually begins at 57 years of age and continues through 57 years of age.  Your health care provider may recommend screening at an earlier age if you have risk factors for colon cancer.  Your health care provider may also recommend using home test kits to check for hidden blood in the stool.  A small camera at the end of a tube can be used to examine your colon directly  (sigmoidoscopy or colonoscopy). This is done to check for the earliest forms of colorectal cancer.  Routine screening usually begins at age 50.  Direct examination of the colon should be repeated every 5-10 years through 57 years of age. However, you may need to be screened more often if early forms of precancerous polyps or small growths are found.  Skin Cancer  Check your skin from head to toe regularly.  Tell your health care provider about any new moles or changes in moles, especially if there is a change in a mole's shape or color.  Also tell your health care provider if you have a mole that is larger than the size of a pencil eraser.  Always use sunscreen. Apply sunscreen liberally and repeatedly throughout the day.  Protect yourself by wearing long sleeves, pants, a wide-brimmed hat, and sunglasses whenever you are outside.  Heart disease, diabetes, and high blood pressure  High blood pressure causes heart disease and increases the risk of stroke. High   blood pressure is more likely to develop in: ? People who have blood pressure in the high end of the normal range (130-139/85-89 mm Hg). ? People who are overweight or obese. ? People who are African American.  If you are 18-39 years of age, have your blood pressure checked every 3-5 years. If you are 40 years of age or older, have your blood pressure checked every year. You should have your blood pressure measured twice-once when you are at a hospital or clinic, and once when you are not at a hospital or clinic. Record the average of the two measurements. To check your blood pressure when you are not at a hospital or clinic, you can use: ? An automated blood pressure machine at a pharmacy. ? A home blood pressure monitor.  If you are between 55 years and 79 years old, ask your health care provider if you should take aspirin to prevent strokes.  Have regular diabetes screenings. This involves taking a blood sample to check your  fasting blood sugar level. ? If you are at a normal weight and have a low risk for diabetes, have this test once every three years after 57 years of age. ? If you are overweight and have a high risk for diabetes, consider being tested at a younger age or more often. Preventing infection Hepatitis B  If you have a higher risk for hepatitis B, you should be screened for this virus. You are considered at high risk for hepatitis B if: ? You were born in a country where hepatitis B is common. Ask your health care provider which countries are considered high risk. ? Your parents were born in a high-risk country, and you have not been immunized against hepatitis B (hepatitis B vaccine). ? You have HIV or AIDS. ? You use needles to inject street drugs. ? You live with someone who has hepatitis B. ? You have had sex with someone who has hepatitis B. ? You get hemodialysis treatment. ? You take certain medicines for conditions, including cancer, organ transplantation, and autoimmune conditions.  Hepatitis C  Blood testing is recommended for: ? Everyone born from 1945 through 1965. ? Anyone with known risk factors for hepatitis C.  Sexually transmitted infections (STIs)  You should be screened for sexually transmitted infections (STIs) including gonorrhea and chlamydia if: ? You are sexually active and are younger than 57 years of age. ? You are older than 57 years of age and your health care provider tells you that you are at risk for this type of infection. ? Your sexual activity has changed since you were last screened and you are at an increased risk for chlamydia or gonorrhea. Ask your health care provider if you are at risk.  If you do not have HIV, but are at risk, it may be recommended that you take a prescription medicine daily to prevent HIV infection. This is called pre-exposure prophylaxis (PrEP). You are considered at risk if: ? You are sexually active and do not regularly use condoms  or know the HIV status of your partner(s). ? You take drugs by injection. ? You are sexually active with a partner who has HIV.  Talk with your health care provider about whether you are at high risk of being infected with HIV. If you choose to begin PrEP, you should first be tested for HIV. You should then be tested every 3 months for as long as you are taking PrEP. Pregnancy  If you are   premenopausal and you may become pregnant, ask your health care provider about preconception counseling.  If you may become pregnant, take 400 to 800 micrograms (mcg) of folic acid every day.  If you want to prevent pregnancy, talk to your health care provider about birth control (contraception). Osteoporosis and menopause  Osteoporosis is a disease in which the bones lose minerals and strength with aging. This can result in serious bone fractures. Your risk for osteoporosis can be identified using a bone density scan.  If you are 41 years of age or older, or if you are at risk for osteoporosis and fractures, ask your health care provider if you should be screened.  Ask your health care provider whether you should take a calcium or vitamin D supplement to lower your risk for osteoporosis.  Menopause may have certain physical symptoms and risks.  Hormone replacement therapy may reduce some of these symptoms and risks. Talk to your health care provider about whether hormone replacement therapy is right for you. Follow these instructions at home:  Schedule regular health, dental, and eye exams.  Stay current with your immunizations.  Do not use any tobacco products including cigarettes, chewing tobacco, or electronic cigarettes.  If you are pregnant, do not drink alcohol.  If you are breastfeeding, limit how much and how often you drink alcohol.  Limit alcohol intake to no more than 1 drink per day for nonpregnant women. One drink equals 12 ounces of beer, 5 ounces of wine, or 1 ounces of hard  liquor.  Do not use street drugs.  Do not share needles.  Ask your health care provider for help if you need support or information about quitting drugs.  Tell your health care provider if you often feel depressed.  Tell your health care provider if you have ever been abused or do not feel safe at home. This information is not intended to replace advice given to you by your health care provider. Make sure you discuss any questions you have with your health care provider. Document Released: 08/29/2010 Document Revised: 07/22/2015 Document Reviewed: 11/17/2014 Elsevier Interactive Patient Education  [employer].

## 2018-01-09 NOTE — Assessment & Plan Note (Signed)
Taking lexapro 10 mg daily and doing well. Refilled today.

## 2018-01-09 NOTE — Assessment & Plan Note (Signed)
Checking lipid panel in 1-2 months as she has recent values and has not been taking lipitor 20 mg daily. Refilled and encouraged to take daily and come back for labs.

## 2018-01-09 NOTE — Progress Notes (Signed)
   Subjective:    Patient ID: Gina Goodwin, female    DOB: 14-May-1960, 57 y.o.   MRN: [id number]  HPI The patient is a 57 YO female coming in for physical.   [facility], [facility] history reviewed and updated.   Review of Systems  Constitutional: Negative.   HENT: Negative.   Eyes: Negative.   Respiratory: Negative for cough, chest tightness and shortness of breath.   Cardiovascular: Negative for chest pain, palpitations and leg swelling.  Gastrointestinal: Negative for abdominal distention, abdominal pain, constipation, diarrhea, nausea and vomiting.  Musculoskeletal: Negative.   Skin: Negative.   Neurological: Negative.   Psychiatric/Behavioral: Negative.       Objective:   Physical Exam  Constitutional: She is oriented to person, place, and time. She appears well-developed and well-nourished.  HENT:  Head: Normocephalic and atraumatic.  Eyes: EOM are normal.  Neck: Normal range of motion.  Cardiovascular: Normal rate and regular rhythm.  Pulmonary/Chest: Effort normal and breath sounds normal. No respiratory distress. She has no wheezes. She has no rales.  Abdominal: Soft. Bowel sounds are normal. She exhibits no distension. There is no tenderness. There is no rebound.  Musculoskeletal: She exhibits no edema.  Neurological: She is alert and oriented to person, place, and time. Coordination normal.  Skin: Skin is warm and dry.  Psychiatric: She has a normal mood and affect.   Vitals:   01/09/18 0900  BP: 122/82  Pulse: 86  Temp: 98 F (36.7 C)  TempSrc: Oral  SpO2: 97%  Weight: 162 lb (73.5 kg)  Height: 5\' 5"  (1.651 m)      Assessment & Plan:

## 2018-01-09 NOTE — Assessment & Plan Note (Signed)
Flu shot declines as she gets at work. Shingrix counseled. Tetanus up to date. Colonoscopy up to date. Mammogram up to date with gyn, pap smear up to date with gyn. Counseled about sun safety and mole surveillance. Counseled about the dangers of distracted driving. Given 10 year screening recommendations.

## 2018-01-23 ENCOUNTER — Other Ambulatory Visit: Payer: Self-pay | Admitting: Internal Medicine

## 2018-01-23 NOTE — Telephone Encounter (Signed)
Done erx 

## 2018-01-23 NOTE — Telephone Encounter (Signed)
Please advise per Dr. Frutoso Chaserawford's absence. Thank you  Control database checked last refill: 07/07/2016 LOV:01/09/2018 NOV: 01/13/2019

## 2018-02-18 ENCOUNTER — Ambulatory Visit: Payer: BLUE CROSS/BLUE SHIELD

## 2018-02-25 ENCOUNTER — Ambulatory Visit
Admission: RE | Admit: 2018-02-25 | Discharge: 2018-02-25 | Disposition: A | Discharge status: Home / Self Care | Payer: BLUE CROSS/BLUE SHIELD | Source: Ambulatory Visit | Attending: Obstetrics and Gynecology | Admitting: Obstetrics and Gynecology

## 2018-02-25 DIAGNOSIS — Z1231 Encounter for screening mammogram for malignant neoplasm of breast: Secondary | ICD-10-CM

## 2018-03-04 DIAGNOSIS — R350 Frequency of micturition: Secondary | ICD-10-CM | POA: Diagnosis not present

## 2018-03-04 DIAGNOSIS — Z6827 Body mass index (BMI) 27.0-27.9, adult: Secondary | ICD-10-CM | POA: Diagnosis not present

## 2018-03-04 DIAGNOSIS — N390 Urinary tract infection, site not specified: Secondary | ICD-10-CM | POA: Diagnosis not present

## 2018-03-04 DIAGNOSIS — Z01419 Encounter for gynecological examination (general) (routine) without abnormal findings: Secondary | ICD-10-CM | POA: Diagnosis not present

## 2018-04-02 DIAGNOSIS — Z113 Encounter for screening for infections with a predominantly sexual mode of transmission: Secondary | ICD-10-CM | POA: Diagnosis not present

## 2018-04-02 DIAGNOSIS — Z114 Encounter for screening for human immunodeficiency virus [HIV]: Secondary | ICD-10-CM | POA: Diagnosis not present

## 2018-04-02 DIAGNOSIS — N760 Acute vaginitis: Secondary | ICD-10-CM | POA: Diagnosis not present

## 2018-05-06 ENCOUNTER — Telehealth: Payer: Self-pay

## 2018-05-06 NOTE — Telephone Encounter (Signed)
LVM for patient to call back and make appt  °

## 2018-05-06 NOTE — Telephone Encounter (Signed)
Recommend visit, if not having any symptoms there may not be a reason for referral.

## 2018-05-06 NOTE — Telephone Encounter (Signed)
Can you please schedule patient for an office visit. Thank you  

## 2018-05-06 NOTE — Telephone Encounter (Signed)
Copied from CRM [id number]. Topic: Referral - Request for Referral >> May 06, 2018  9:34 AM Jolayne Haines L wrote: Has patient seen PCP for this complaint? NO *If NO, is insurance requiring patient see PCP for this issue before PCP can refer them? N/A Referral for which specialty: Cardiology  Preferred provider/office: Dr Royann Shivers at [address], [place] [address] Reason for referral: Patient states her mom just passed away because of reasons with with her heart & her sister just had a stress test & they found some abnormal findings.

## 2018-05-22 ENCOUNTER — Telehealth: Payer: Self-pay

## 2018-05-22 NOTE — Telephone Encounter (Signed)
REQ NOTES 

## 2018-05-24 NOTE — Telephone Encounter (Signed)
Notes on file from dr. Burns Spain, [facility], [phone].

## 2018-07-01 DIAGNOSIS — M25572 Pain in left ankle and joints of left foot: Secondary | ICD-10-CM | POA: Diagnosis not present

## 2018-07-01 DIAGNOSIS — M79605 Pain in left leg: Secondary | ICD-10-CM | POA: Diagnosis not present

## 2018-07-18 ENCOUNTER — Telehealth: Payer: Self-pay

## 2018-07-18 NOTE — Telephone Encounter (Signed)
YOUR CARDIOLOGY TEAM HAS ARRANGED FOR AN E-VISIT FOR YOUR APPOINTMENT - PLEASE REVIEW IMPORTANT INFORMATION BELOW SEVERAL DAYS PRIOR TO YOUR APPOINTMENT  Due to the recent COVID-19 pandemic, we are transitioning in-person office visits to tele-medicine visits in an effort to decrease unnecessary exposure to our patients, their families, and staff. These visits are billed to your insurance just like a normal visit is. We also encourage you to sign up for MyChart if you have not already done so. You will need a smartphone if possible. For patients that do not have this, we can still complete the visit using a regular telephone but do prefer a smartphone to enable video when possible. You may have a family member that lives with you that can help. If possible, we also ask that you have a blood pressure cuff and scale at home to measure your blood pressure, heart rate and weight prior to your scheduled appointment. Patients with clinical needs that need an in-person evaluation and testing will still be able to come to the office if absolutely necessary. If you have any questions, feel free to call our office.     YOUR PROVIDER WILL BE USING THE FOLLOWING PLATFORM TO COMPLETE YOUR VISIT: Doxy.Me  . IF USING MYCHART - How to Download the MyChart App to Your SmartPhone   - If Apple, go to App Store and type in MyChart in the search bar and download the app. If Android, ask patient to go to Google Play Store and type in MyChart in the search bar and download the app. The app is free but as with any other app downloads, your phone may require you to verify saved payment information or Apple/Android password.  - You will need to then log into the app with your MyChart username and password, and select [facility] as your healthcare provider to link the account.  - When it is time for your visit, go to the MyChart app, find appointments, and click Begin Video Visit. Be sure to Select Allow for your device to  access the Microphone and Camera for your visit. You will then be connected, and your provider will be with you shortly.  **If you have any issues connecting or need assistance, please contact MyChart service desk [phone] ([phone])**  **If using a computer, in order to ensure the best quality for your visit, you will need to use either of the following Internet Browsers: Google Chrome or Microsoft Edge**  . IF USING DOXIMITY or DOXY.ME - The staff will give you instructions on receiving your link to join the meeting the day of your visit.      2-3 DAYS BEFORE YOUR APPOINTMENT  You will receive a telephone call from one of our Heart[facility] team members - your caller ID may say "Unknown caller." If this is a video visit, we will walk you through how to get the video launched on your phone. We will remind you check your blood pressure, heart rate and weight prior to your scheduled appointment. If you have an Apple Watch or Kardia, please upload any pertinent ECG strips the day before or morning of your appointment to MyChart. Our staff will also make sure you have reviewed the consent and agree to move forward with your scheduled tele-health visit.     THE DAY OF YOUR APPOINTMENT  Approximately 15 minutes prior to your scheduled appointment, you will receive a telephone call from one of HeartCare team - your caller ID may say "Unknown caller."    Our staff will confirm medications, vital signs for the day and any symptoms you may be experiencing. Please have this information available prior to the time of visit start. It may also be helpful for you to have a pad of paper and pen handy for any instructions given during your visit. They will also walk you through joining the smartphone meeting if this is a video visit.    CONSENT FOR TELE-HEALTH VISIT - PLEASE REVIEW  I hereby voluntarily request, consent and authorize [facility] and its employed or contracted physicians, physician  assistants, nurse practitioners or other licensed health care professionals (the Practitioner), to provide me with telemedicine health care services (the "Services") as deemed necessary by the treating Practitioner. I acknowledge and consent to receive the Services by the Practitioner via telemedicine. I understand that the telemedicine visit will involve communicating with the Practitioner through live audiovisual communication technology and the disclosure of certain medical information by electronic transmission. I acknowledge that I have been given the opportunity to request an in-person assessment or other available alternative prior to the telemedicine visit and am voluntarily participating in the telemedicine visit.  I understand that I have the right to withhold or withdraw my consent to the use of telemedicine in the course of my care at any time, without affecting my right to future care or treatment, and that the Practitioner or I may terminate the telemedicine visit at any time. I understand that I have the right to inspect all information obtained and/or recorded in the course of the telemedicine visit and may receive copies of available information for a reasonable fee.  I understand that some of the potential risks of receiving the Services via telemedicine include:  . Delay or interruption in medical evaluation due to technological equipment failure or disruption; . Information transmitted may not be sufficient (e.g. poor resolution of images) to allow for appropriate medical decision making by the Practitioner; and/or  . In rare instances, security protocols could fail, causing a breach of personal health information.  Furthermore, I acknowledge that it is my responsibility to provide information about my medical history, conditions and care that is complete and accurate to the best of my ability. I acknowledge that Practitioner's advice, recommendations, and/or decision may be based on  factors not within their control, such as incomplete or inaccurate data provided by me or distortions of diagnostic images or specimens that may result from electronic transmissions. I understand that the practice of medicine is not an exact science and that Practitioner makes no warranties or guarantees regarding treatment outcomes. I acknowledge that I will receive a copy of this consent concurrently upon execution via email to the email address I last provided but may also request a printed copy by calling the office of [facility].    I understand that my insurance will be billed for this visit.   I have read or had this consent read to me. . I understand the contents of this consent, which adequately explains the benefits and risks of the Services being provided via telemedicine.  . I have been provided ample opportunity to ask questions regarding this consent and the Services and have had my questions answered to my satisfaction. . I give my informed consent for the services to be provided through the use of telemedicine in my medical care  By participating in this telemedicine visit I agree to the above.  

## 2018-07-23 ENCOUNTER — Other Ambulatory Visit: Payer: Self-pay

## 2018-07-23 ENCOUNTER — Telehealth: Payer: BLUE CROSS/BLUE SHIELD | Admitting: Cardiology

## 2018-07-23 NOTE — Progress Notes (Deleted)
{Choose 1 Note Type (Telehealth Visit or Telephone Visit):[phone]}   Date:  07/23/2018   ID:  Gina Goodwin, MRN [id number]  {Patient Location:([phone]::"Home"} {Provider Location:[phone]::"Home"}  PCP:  Myrlene Brokerrawford, Elizabeth A, MD  Cardiologist:  No primary care provider on file. *** Electrophysiologist:  None   Evaluation Performed:  Consultation - Gina Goodwin was referred by Dr. Langston MaskerMorris for the evaluation of any history of coronary artery disease.  Chief Complaint: Here for the assessment of any history of coronary artery disease  History of Present Illness:    Gina Goodwin is a 58 y.o. female with hyperlipidemia on atorvastatin here for the evaluation of family history of coronary artery disease.  The patient {does/does not:200015} have symptoms concerning for COVID-19 infection (fever, chills, cough, or new shortness of breath).    Past Medical History:  Diagnosis Date  . Adjustment disorder with anxious mood 07/07/2016  . Chest heaviness 05/14/2014  . Cough 07/11/2017  . Cyst of skin 10/28/2015  . Dyslipidemia   . Hearing loss    wears aide on Left side  . Hyperlipidemia   . Overweight 05/11/2010   Qualifier: Diagnosis of  By: Felicity CoyerLeschber MD, Raenette RoverValerie A   . Psoriasis   . PSORIASIS 09/28/2009   Qualifier: Diagnosis of  By: Felicity CoyerLeschber MD, Raenette RoverValerie A    Past Surgical History:  Procedure Laterality Date  . None       No outpatient medications have been marked as taking for the 07/23/18 encounter (Appointment) with Jake BatheSkains, Gladiola Madore C, MD.     Allergies:   Patient has no known allergies.   Social History   Tobacco Use  . Smoking status: Never Smoker  . Smokeless tobacco: Never Used  Substance Use Topics  . Alcohol use: Yes    Comment: Occasional   . Drug use: Not on file     Family Hx: The patient's family history includes Breast cancer in an other family member; Heart disease in her father; Hyperlipidemia in her father.  ROS:    Please see the history of present illness.    *** All other systems reviewed and are negative.   Prior CV studies:   The following studies were reviewed today:  ***  Labs/Other Tests and Data Reviewed:    EKG:  An ECG dated 05/12/2014 was personally reviewed today and demonstrated:  Sinus rhythm 60 with no other abnormalities  Recent Labs: No results found for requested labs within last 8760 hours.   Recent Lipid Panel Lab Results  Component Value Date/Time   CHOL 210 (H) 07/14/2016 12:30 PM   TRIG 89.0 07/14/2016 12:30 PM   TRIG 88 08/27/2009   HDL 72.70 07/14/2016 12:30 PM   CHOLHDL 3 07/14/2016 12:30 PM   LDLCALC 120 (H) 07/14/2016 12:30 PM   LDLDIRECT 142.0 06/03/2012 10:44 AM    Wt Readings from Last 3 Encounters:  01/09/18 162 lb (73.5 kg)  07/11/17 167 lb (75.8 kg)  05/11/17 167 lb 2 oz (75.8 kg)     Objective:    Vital Signs:  There were no vitals taken for this visit.   {HeartCare Virtual Exam (Optional):[id number]::"VITAL SIGNS:  reviewed"}  ASSESSMENT & PLAN:    Family history of coronary artery disease  COVID-19 Education: The signs and symptoms of COVID-19 were discussed with the patient and how to seek care for testing (follow up with PCP or arrange E-visit).  ***The importance of social distancing was discussed today.  Time:   Today, I have spent ***  minutes with the patient with telehealth technology discussing the above problems.     Medication Adjustments/Labs and Tests Ordered: Current medicines are reviewed at length with the patient today.  Concerns regarding medicines are outlined above.   Tests Ordered: No orders of the defined types were placed in this encounter.   Medication Changes: No orders of the defined types were placed in this encounter.   Disposition:  Follow up {follow up:15908}  Signed, Donato Schultz, MD  07/23/2018 1:25 PM    [facility]

## 2018-07-31 DIAGNOSIS — M79605 Pain in left leg: Secondary | ICD-10-CM | POA: Diagnosis not present

## 2018-07-31 DIAGNOSIS — M25572 Pain in left ankle and joints of left foot: Secondary | ICD-10-CM | POA: Diagnosis not present

## 2018-07-31 DIAGNOSIS — S93402D Sprain of unspecified ligament of left ankle, subsequent encounter: Secondary | ICD-10-CM | POA: Diagnosis not present

## 2018-09-11 ENCOUNTER — Other Ambulatory Visit: Payer: Self-pay | Admitting: Internal Medicine

## 2018-09-11 NOTE — Telephone Encounter (Signed)
Control database checked last refill: 03/20/2018  LOV: 01/09/2018 NOV: 01/13/2019

## 2018-10-09 ENCOUNTER — Ambulatory Visit: Payer: BLUE CROSS/BLUE SHIELD | Admitting: Internal Medicine

## 2018-10-10 ENCOUNTER — Telehealth: Payer: Self-pay | Admitting: Cardiovascular Disease

## 2018-10-10 ENCOUNTER — Ambulatory Visit: Payer: BC Managed Care – PPO | Admitting: Cardiovascular Disease

## 2018-10-10 NOTE — Telephone Encounter (Signed)
Message sent to Linda Hedges regarding referral for a cardiology appointment.  Patient has cancelled 3 new patient appointments to date.

## 2018-11-07 DIAGNOSIS — H903 Sensorineural hearing loss, bilateral: Secondary | ICD-10-CM | POA: Diagnosis not present

## 2018-12-05 ENCOUNTER — Ambulatory Visit: Payer: BC Managed Care – PPO | Admitting: Cardiovascular Disease

## 2018-12-16 ENCOUNTER — Other Ambulatory Visit: Payer: Self-pay | Admitting: Obstetrics & Gynecology

## 2018-12-16 ENCOUNTER — Other Ambulatory Visit: Payer: Self-pay | Admitting: Obstetrics and Gynecology

## 2018-12-16 DIAGNOSIS — Z1231 Encounter for screening mammogram for malignant neoplasm of breast: Secondary | ICD-10-CM

## 2019-01-13 ENCOUNTER — Encounter: Payer: BLUE CROSS/BLUE SHIELD | Admitting: Internal Medicine

## 2019-01-24 ENCOUNTER — Telehealth: Payer: Self-pay | Admitting: Internal Medicine

## 2019-01-24 NOTE — Telephone Encounter (Signed)
Patient is calling to report that she tested positive for COVID. She spoke with a nurse. And she is feeling fine.

## 2019-01-27 NOTE — Telephone Encounter (Signed)
Noted  

## 2019-01-28 ENCOUNTER — Encounter: Payer: Self-pay | Admitting: Cardiovascular Disease

## 2019-01-28 ENCOUNTER — Ambulatory Visit (INDEPENDENT_AMBULATORY_CARE_PROVIDER_SITE_OTHER): Payer: BC Managed Care – PPO | Admitting: Cardiovascular Disease

## 2019-01-28 ENCOUNTER — Other Ambulatory Visit: Payer: Self-pay

## 2019-01-28 VITALS — BP 140/82 | HR 77 | Ht 65.0 in | Wt 163.0 lb

## 2019-01-28 DIAGNOSIS — Z8249 Family history of ischemic heart disease and other diseases of the circulatory system: Secondary | ICD-10-CM

## 2019-01-28 DIAGNOSIS — E7800 Pure hypercholesterolemia, unspecified: Secondary | ICD-10-CM

## 2019-01-28 DIAGNOSIS — R079 Chest pain, unspecified: Secondary | ICD-10-CM | POA: Diagnosis not present

## 2019-01-28 NOTE — Patient Instructions (Addendum)
Medication Instructions:  No changes *If you need a refill on your cardiac medications before your next appointment, please call your pharmacy*  Lab Work: Your provider would like for you to return in a few weeks to have the following labs drawn: fasting lipid. You do not need an appointment for the lab. Once in our office lobby there is a podium where you can sign in and ring the doorbell to alert [place] that you are here. The lab is open from 8:00 am to 4:30 pm; closed for lunch from 12:45pm-1:45pm.  If you have labs (blood work) drawn today and your tests are completely normal, you will receive your results only by: Marland Kitchen MyChart Message (if you have MyChart) OR . A paper copy in the mail If you have any lab test that is abnormal or we need to change your treatment, we will call you to review the results.  Testing/Procedures: Your physician has requested that you have an exercise tolerance test. For further information please visit [email or web address]. Please also follow instruction sheet, as given. This will take place at [place], [address].  Do not drink or eat foods with caffeine for 24 hours before the test. (Chocolate, coffee, tea, or energy drinks)  If you use an inhaler, bring it with you to the test.  Do not smoke for 4 hours before the test.  Wear comfortable shoes and clothing.   Follow-Up: At [facility], you and your health needs are our priority.  As part of our continuing mission to provide you with exceptional heart care, we have created designated Provider Care Teams.  These Care Teams include your primary Cardiologist (physician) and Advanced Practice Providers (APPs -  Physician Assistants and Nurse Practitioners) who all work together to provide you with the care you need, when you need it.  Your next appointment:   12 month(s)  The format for your next appointment:   In Person  Provider:   Sanda Klein, MD

## 2019-01-28 NOTE — Progress Notes (Signed)
Cardiology Office Note:    Date:  01/30/2019   ID:  Gina Goodwin, DOB Apr 01, 1960, MRN [id number]  PCP:  Gina Broker, MD  Cardiologist:  Gina Goodwin:  None   Referring MD: Gina Honour, DO   Chief Complaint  Patient presents with  . Chest Pain    Family history of premature CAD  Gina Goodwin is a 58 y.o. female who is being seen today for the evaluation of chest pain at the request of Morris, Megan, DO.   History of Present Illness:    Gina Goodwin is a 58 y.o. female with a hx of hypercholesterolemia, but otherwise good health, strong family history of premature death from coronary disease.  Gina Goodwin's mother was Gina Goodwin, who was my patient for many years.  Gina Goodwin did not have coronary problems but did have arrhythmia (pacemaker and atrial fibrillation).  She died from metastatic squamous cell cancer of the skin.  Gina Goodwin's sister Gina Goodwin is also my patient (she had an abnormal stress test earlier this year, but her coronary CTA showed no stenoses and a calcium score of 0).  Gina Goodwin and Gina Goodwin's father had very early onset of coronary artery disease.  He had bypass surgery and a defibrillator and passed away at age 38.  Their paternal grandfather also died of heart disease at age 3.  Gina Goodwin has experienced recurrent episodes of retrosternal chest tightness this year, for a few months now..  The symptoms did not occur with physical activity, but they are often related t emotional stress (she has been going through a divorce).  About 18 days ago she was diagnosed with COVID-19 infection but had relatively mild symptoms from which she has recovered fully.  The infection itself did not appear to have any impact on her chest tightness.  Otherwise she has no cardiovascular complaints. The patient specifically denies any chest pain with exertion, dyspnea at rest or with exertion, orthopnea, paroxysmal nocturnal dyspnea, syncope, palpitations,  focal neurological deficits, intermittent claudication, lower extremity edema, unexplained weight gain, cough, hemoptysis or wheezing.   Past Medical History:  Diagnosis Date  . Adjustment disorder with anxious mood 07/07/2016  . Chest heaviness 05/14/2014  . Cough 07/11/2017  . Cyst of skin 10/28/2015  . Dyslipidemia   . Hearing loss    wears aide on Left side  . Hyperlipidemia   . Overweight 05/11/2010   Qualifier: Diagnosis of  By: Felicity Coyer MD, Raenette Rover Psoriasis   . PSORIASIS 09/28/2009   Qualifier: Diagnosis of  By: Felicity Coyer MD, Raenette Rover     Past Surgical History:  Procedure Laterality Date  . None      Current Medications: Current Meds  Medication Sig  . ALPRAZolam (XANAX) 0.5 MG tablet TAKE 1 TABLET BY MOUTH EVERY DAY AS NEEDED  . atorvastatin (LIPITOR) 20 MG tablet Take 1 tablet (20 mg total) by mouth daily.  . Calcium Carbonate-Vitamin D (CALTRATE 600+D) 600-400 MG-UNIT per tablet Take 1 tablet by mouth daily.  Marland Kitchen escitalopram (LEXAPRO) 10 MG tablet Take 1 tablet (10 mg total) by mouth daily.     Allergies:   Patient has no known allergies.   Social History   Socioeconomic History  . Marital status: Divorced    Spouse name: Not on file  . Number of children: Not on file  . Years of education: Not on file  . Highest education level: Not on file  Occupational History  . Not on file  Social Needs  .  Financial resource strain: Not on file  . Food insecurity    Worry: Not on file    Inability: Not on file  . Transportation needs    Medical: Not on file    Non-medical: Not on file  Tobacco Use  . Smoking status: Never Smoker  . Smokeless tobacco: Never Used  Substance and Sexual Activity  . Alcohol use: Yes    Comment: Occasional   . Drug use: Not on file  . Sexual activity: Not on file  Lifestyle  . Physical activity    Days per week: Not on file    Minutes per session: Not on file  . Stress: Not on file  Relationships  . Social Wellsite geologistconnections     Talks on phone: Not on file    Gets together: Not on file    Attends religious service: Not on file    Active member of club or organization: Not on file    Attends meetings of clubs or organizations: Not on file    Relationship status: Not on file  Other Topics Concern  . Not on file  Social History Narrative   Employed in [employer] at [employer]. Divorced, 3 children -     Family History: The patient's family history includes Breast cancer in an other family member; Heart disease in her father; Hyperlipidemia in her father.  ROS:   Please see the history of present illness.     All other systems reviewed and are negative.  EKGs/Labs/Other Studies Reviewed:    The following studies were reviewed today:   EKG:  EKG is ordered today.  The ekg ordered today demonstrates normal sinus rhythm, normal tracing  Recent Labs: No results found for requested labs within last 8760 hours.  Recent Lipid Panel    Component Value Date/Time   CHOL 210 (H) 07/14/2016 1230   TRIG 89.0 07/14/2016 1230   TRIG 88 08/27/2009   HDL 72.70 07/14/2016 1230   CHOLHDL 3 07/14/2016 1230   VLDL 17.8 07/14/2016 1230   LDLCALC 120 (H) 07/14/2016 1230   LDLDIRECT 142.0 06/03/2012 1044    Physical Exam:    VS:  BP 140/82   Pulse 77   Ht 5\' 5"  (1.651 m)   Wt 163 lb (73.9 kg)   SpO2 97%   BMI 27.12 kg/m     Wt Readings from Last 3 Encounters:  01/28/19 163 lb (73.9 kg)  01/09/18 162 lb (73.5 kg)  07/11/17 167 lb (75.8 kg)     GEN:  Well nourished, well developed in no acute distress HEENT: Normal NECK: No JVD; No carotid bruits LYMPHATICS: No lymphadenopathy CARDIAC: RRR, no murmurs, rubs, gallops RESPIRATORY:  Clear to auscultation without rales, wheezing or rhonchi  ABDOMEN: Soft, non-tender, non-distended MUSCULOSKELETAL:  No edema; No deformity  SKIN: Warm and dry NEUROLOGIC:  Alert and oriented x 3 PSYCHIATRIC:  Normal affect   ASSESSMENT:    1. Chest pain of uncertain etiology    2. Pure hypercholesterolemia   3. Family history of premature CAD    PLAN:    In order of problems listed above:  1. Chest pain: Her symptoms are atypical, but she does have hyperlipidemia and a very striking history of early onset CAD in her father and paternal grandfather.  We will schedule for a treadmill stress test. 2. HLP: In 2018 her LDL cholesterol was 120 and her HDL was 72, but I do not think she was taking medication at the time.  It  is appropriate to recheck those values.   Medication Adjustments/Labs and Tests Ordered: Current medicines are reviewed at length with the patient today.  Concerns regarding medicines are outlined above.  Orders Placed This Encounter  Procedures  . Lipid panel  . Exercise Tolerance Test  . EKG 12-Lead   No orders of the defined types were placed in this encounter.   Patient Instructions  Medication Instructions:  No changes *If you need a refill on your cardiac medications before your next appointment, please call your pharmacy*  Lab Work: Your provider would like for you to return in a few weeks to have the following labs drawn: fasting lipid. You do not need an appointment for the lab. Once in our office lobby there is a podium where you can sign in and ring the doorbell to alert [place] that you are here. The lab is open from 8:00 am to 4:30 pm; closed for lunch from 12:45pm-1:45pm.  If you have labs (blood work) drawn today and your tests are completely normal, you will receive your results only by: Marland Kitchen MyChart Message (if you have MyChart) OR . A paper copy in the mail If you have any lab test that is abnormal or we need to change your treatment, we will call you to review the results.  Testing/Procedures: Your physician has requested that you have an exercise tolerance test. For further information please visit [email or web address]. Please also follow instruction sheet, as given. This will take place at [place], [address].  Do  not drink or eat foods with caffeine for 24 hours before the test. (Chocolate, coffee, tea, or energy drinks)  If you use an inhaler, bring it with you to the test.  Do not smoke for 4 hours before the test.  Wear comfortable shoes and clothing.   Follow-Up: At [facility], you and your health needs are our priority.  As part of our continuing mission to provide you with exceptional heart care, we have created designated Provider Care Teams.  These Care Teams include your primary Cardiologist (physician) and Advanced Practice Providers (APPs -  Physician Assistants and Nurse Practitioners) who all work together to provide you with the care you need, when you need it.  Your next appointment:   12 month(s)  The format for your next appointment:   In Person  Provider:   Sanda Klein, MD      Signed, Sanda Klein, MD  01/30/2019 6:18 PM    [place]

## 2019-01-31 ENCOUNTER — Other Ambulatory Visit: Payer: Self-pay

## 2019-01-31 DIAGNOSIS — Z20822 Contact with and (suspected) exposure to covid-19: Secondary | ICD-10-CM

## 2019-02-04 LAB — NOVEL CORONAVIRUS, NAA: SARS-CoV-2, NAA: DETECTED — AB

## 2019-02-05 ENCOUNTER — Ambulatory Visit: Payer: Self-pay | Admitting: *Deleted

## 2019-02-05 NOTE — Telephone Encounter (Signed)
See result note dated 02/05/2019

## 2019-02-11 ENCOUNTER — Other Ambulatory Visit: Payer: Self-pay | Admitting: Internal Medicine

## 2019-02-11 DIAGNOSIS — F4322 Adjustment disorder with anxiety: Secondary | ICD-10-CM

## 2019-02-27 ENCOUNTER — Other Ambulatory Visit: Payer: Self-pay

## 2019-02-27 ENCOUNTER — Ambulatory Visit
Admission: RE | Admit: 2019-02-27 | Discharge: 2019-02-27 | Disposition: A | Discharge status: Home / Self Care | Payer: BC Managed Care – PPO | Source: Ambulatory Visit | Attending: Obstetrics & Gynecology | Admitting: Obstetrics & Gynecology

## 2019-02-27 DIAGNOSIS — Z1231 Encounter for screening mammogram for malignant neoplasm of breast: Secondary | ICD-10-CM

## 2019-03-06 DIAGNOSIS — Z113 Encounter for screening for infections with a predominantly sexual mode of transmission: Secondary | ICD-10-CM | POA: Diagnosis not present

## 2019-03-06 DIAGNOSIS — Z13820 Encounter for screening for osteoporosis: Secondary | ICD-10-CM | POA: Diagnosis not present

## 2019-03-06 DIAGNOSIS — Z01419 Encounter for gynecological examination (general) (routine) without abnormal findings: Secondary | ICD-10-CM | POA: Diagnosis not present

## 2019-03-06 DIAGNOSIS — R87810 Cervical high risk human papillomavirus (HPV) DNA test positive: Secondary | ICD-10-CM | POA: Diagnosis not present

## 2019-03-06 DIAGNOSIS — Z6827 Body mass index (BMI) 27.0-27.9, adult: Secondary | ICD-10-CM | POA: Diagnosis not present

## 2019-03-06 DIAGNOSIS — K068 Other specified disorders of gingiva and edentulous alveolar ridge: Secondary | ICD-10-CM | POA: Diagnosis not present

## 2019-03-10 ENCOUNTER — Other Ambulatory Visit: Payer: Self-pay | Admitting: Internal Medicine

## 2019-03-10 DIAGNOSIS — E782 Mixed hyperlipidemia: Secondary | ICD-10-CM

## 2019-03-11 ENCOUNTER — Other Ambulatory Visit: Payer: Self-pay | Admitting: Internal Medicine

## 2019-03-11 DIAGNOSIS — E782 Mixed hyperlipidemia: Secondary | ICD-10-CM

## 2019-03-14 ENCOUNTER — Encounter: Payer: Self-pay | Admitting: Internal Medicine

## 2019-03-14 ENCOUNTER — Other Ambulatory Visit: Payer: Self-pay | Admitting: Internal Medicine

## 2019-03-14 DIAGNOSIS — E782 Mixed hyperlipidemia: Secondary | ICD-10-CM

## 2019-03-25 DIAGNOSIS — R87810 Cervical high risk human papillomavirus (HPV) DNA test positive: Secondary | ICD-10-CM | POA: Diagnosis not present

## 2019-03-26 ENCOUNTER — Telehealth: Payer: Self-pay | Admitting: Cardiovascular Disease

## 2019-03-26 ENCOUNTER — Other Ambulatory Visit: Payer: Self-pay | Admitting: Cardiovascular Disease

## 2019-03-26 DIAGNOSIS — E782 Mixed hyperlipidemia: Secondary | ICD-10-CM

## 2019-03-26 DIAGNOSIS — N871 Moderate cervical dysplasia: Secondary | ICD-10-CM | POA: Diagnosis not present

## 2019-03-26 DIAGNOSIS — N879 Dysplasia of cervix uteri, unspecified: Secondary | ICD-10-CM | POA: Diagnosis not present

## 2019-03-26 MED ORDER — ATORVASTATIN CALCIUM 20 MG PO TABS: 20.0000 mg | ORAL_TABLET | Freq: Every day | ORAL | 3 refills | Status: DC

## 2019-03-26 NOTE — Telephone Encounter (Signed)
The patient would like someone from the billing office to look into the specifics from her visit with Dr. Salena Saner from 01-28-19. Her insurance  only discount for the bill was $54 and she was charged the rest of the visit cost. The patient thought the visit would have counted towards her 2020 insurance plan. Please call once more information is obtained

## 2019-03-26 NOTE — Telephone Encounter (Signed)
°*  STAT* If patient is at the pharmacy, call can be transferred to refill team.   1. Which medications need to be refilled? (please list name of each medication and dose if known)  atorvastatin (LIPITOR) 20 MG tablet  2. Which pharmacy/location (including street and city if local pharmacy) is medication to be sent to? [id number] - [place], [place] - [address] AT [address] [facility] [address]  3. Do they need a 30 day or 90 day supply? 90 with refills

## 2019-03-27 ENCOUNTER — Ambulatory Visit: Payer: BC Managed Care – PPO | Admitting: Internal Medicine

## 2019-04-03 ENCOUNTER — Other Ambulatory Visit: Payer: Self-pay | Admitting: Internal Medicine

## 2019-04-03 NOTE — Telephone Encounter (Signed)
Called pt LVM stating that an OV is needed for refill. Pt has not been seen since 2019.

## 2019-04-03 NOTE — Telephone Encounter (Signed)
Filled 09/12/2018 Alprazolam 0.5 Mg Tablet 15#  Last OV 01/09/18 Next OV 04/10/19 (canceled)

## 2019-04-10 ENCOUNTER — Encounter: Payer: BC Managed Care – PPO | Admitting: Internal Medicine

## 2019-04-10 ENCOUNTER — Ambulatory Visit: Payer: BC Managed Care – PPO | Admitting: Internal Medicine

## 2019-05-01 DIAGNOSIS — E785 Hyperlipidemia, unspecified: Secondary | ICD-10-CM | POA: Diagnosis not present

## 2019-05-01 LAB — LIPID PANEL
Chol/HDL Ratio: 2.6 ratio (ref 0.0–4.4)
Cholesterol, Total: 202 mg/dL — ABNORMAL HIGH (ref 100–199)
HDL: 79 mg/dL (ref >39)
LDL Chol Calc (NIH): 113 mg/dL — ABNORMAL HIGH (ref 0–99)
Triglycerides: 54 mg/dL (ref 0–149)
VLDL Cholesterol Cal: 10 mg/dL (ref 5–40)

## 2019-05-02 ENCOUNTER — Telehealth: Payer: Self-pay | Admitting: Cardiovascular Disease

## 2019-05-02 DIAGNOSIS — E7800 Pure hypercholesterolemia, unspecified: Secondary | ICD-10-CM

## 2019-05-02 NOTE — Telephone Encounter (Signed)
Left message for patient. Lab results have been released to Mychart. CT calcium score ordered and scheduling will be in touch with patient to get that scheduled soon.

## 2019-05-02 NOTE — Telephone Encounter (Signed)
New Message     Pt is calling for results from lab work  She also says she is needing a calcium score done    Please advise

## 2019-05-08 ENCOUNTER — Ambulatory Visit (INDEPENDENT_AMBULATORY_CARE_PROVIDER_SITE_OTHER)
Admission: RE | Admit: 2019-05-08 | Discharge: 2019-05-08 | Disposition: A | Discharge status: Home / Self Care | Payer: Self-pay | Source: Ambulatory Visit | Attending: Cardiovascular Disease | Admitting: Cardiovascular Disease

## 2019-05-08 ENCOUNTER — Other Ambulatory Visit: Payer: Self-pay

## 2019-05-08 ENCOUNTER — Encounter: Payer: Self-pay | Admitting: Internal Medicine

## 2019-05-08 ENCOUNTER — Ambulatory Visit (INDEPENDENT_AMBULATORY_CARE_PROVIDER_SITE_OTHER): Payer: BC Managed Care – PPO | Admitting: Internal Medicine

## 2019-05-08 VITALS — BP 148/94 | HR 81 | Temp 99.0°F | Ht 65.0 in | Wt 166.1 lb

## 2019-05-08 DIAGNOSIS — Z0000 Encounter for general adult medical examination without abnormal findings: Secondary | ICD-10-CM | POA: Diagnosis not present

## 2019-05-08 DIAGNOSIS — E782 Mixed hyperlipidemia: Secondary | ICD-10-CM

## 2019-05-08 DIAGNOSIS — E7800 Pure hypercholesterolemia, unspecified: Secondary | ICD-10-CM

## 2019-05-08 DIAGNOSIS — F4322 Adjustment disorder with anxiety: Secondary | ICD-10-CM | POA: Diagnosis not present

## 2019-05-08 DIAGNOSIS — R931 Abnormal findings on diagnostic imaging of heart and coronary circulation: Secondary | ICD-10-CM | POA: Diagnosis not present

## 2019-05-08 MED ORDER — ALPRAZOLAM 0.5 MG PO TABS: 0.5000 mg | ORAL_TABLET | Freq: Every day | ORAL | 2 refills | Status: AC | PRN

## 2019-05-08 MED ORDER — ESCITALOPRAM OXALATE 10 MG PO TABS: 10.0000 mg | ORAL_TABLET | Freq: Every day | ORAL | 3 refills | Status: DC

## 2019-05-08 NOTE — Assessment & Plan Note (Signed)
Flu shot declines. Shingrix counseled. Tetanus declines. Colonoscopy up to date. Mammogram up to date, pap smear up to date with gyn getting leep April and dexa up to date. Counseled about sun safety and mole surveillance. Counseled about the dangers of distracted driving. Given 10 year screening recommendations.

## 2019-05-08 NOTE — Patient Instructions (Addendum)
Think about getting the shingles vaccine in the near future.   Health Maintenance, Female Adopting a healthy lifestyle and getting preventive care are important in promoting health and wellness. Ask your health care provider about:  The right schedule for you to have regular tests and exams.  Things you can do on your own to prevent diseases and keep yourself healthy. What should I know about diet, weight, and exercise? Eat a healthy diet   Eat a diet that includes plenty of vegetables, fruits, low-fat dairy products, and lean protein.  Do not eat a lot of foods that are high in solid fats, added sugars, or sodium. Maintain a healthy weight Body mass index (BMI) is used to identify weight problems. It estimates body fat based on height and weight. Your health care provider can help determine your BMI and help you achieve or maintain a healthy weight. Get regular exercise Get regular exercise. This is one of the most important things you can do for your health. Most adults should:  Exercise for at least 150 minutes each week. The exercise should increase your heart rate and make you sweat (moderate-intensity exercise).  Do strengthening exercises at least twice a week. This is in addition to the moderate-intensity exercise.  Spend less time sitting. Even light physical activity can be beneficial. Watch cholesterol and blood lipids Have your blood tested for lipids and cholesterol at 59 years of age, then have this test every 5 years. Have your cholesterol levels checked more often if:  Your lipid or cholesterol levels are high.  You are older than 59 years of age.  You are at high risk for heart disease. What should I know about cancer screening? Depending on your health history and family history, you may need to have cancer screening at various ages. This may include screening for:  Breast cancer.  Cervical cancer.  Colorectal cancer.  Skin cancer.  Lung cancer. What  should I know about heart disease, diabetes, and high blood pressure? Blood pressure and heart disease  High blood pressure causes heart disease and increases the risk of stroke. This is more likely to develop in people who have high blood pressure readings, are of African descent, or are overweight.  Have your blood pressure checked: ? Every 3-5 years if you are 73-15 years of age. ? Every year if you are 30 years old or older. Diabetes Have regular diabetes screenings. This checks your fasting blood sugar level. Have the screening done:  Once every three years after age 75 if you are at a normal weight and have a low risk for diabetes.  More often and at a younger age if you are overweight or have a high risk for diabetes. What should I know about preventing infection? Hepatitis B If you have a higher risk for hepatitis B, you should be screened for this virus. Talk with your health care provider to find out if you are at risk for hepatitis B infection. Hepatitis C Testing is recommended for:  Everyone born from [birth year] through 1965.  Anyone with known risk factors for hepatitis C. Sexually transmitted infections (STIs)  Get screened for STIs, including gonorrhea and chlamydia, if: ? You are sexually active and are younger than 59 years of age. ? You are older than 59 years of age and your health care provider tells you that you are at risk for this type of infection. ? Your sexual activity has changed since you were last screened, and you are at  increased risk for chlamydia or gonorrhea. Ask your health care provider if you are at risk.  Ask your health care provider about whether you are at high risk for HIV. Your health care provider may recommend a prescription medicine to help prevent HIV infection. If you choose to take medicine to prevent HIV, you should first get tested for HIV. You should then be tested every 3 months for as long as you are taking the medicine. Pregnancy  If  you are about to stop having your period (premenopausal) and you may become pregnant, seek counseling before you get pregnant.  Take 400 to 800 micrograms (mcg) of folic acid every day if you become pregnant.  Ask for birth control (contraception) if you want to prevent pregnancy. Osteoporosis and menopause Osteoporosis is a disease in which the bones lose minerals and strength with aging. This can result in bone fractures. If you are 36 years old or older, or if you are at risk for osteoporosis and fractures, ask your health care provider if you should:  Be screened for bone loss.  Take a calcium or vitamin D supplement to lower your risk of fractures.  Be given hormone replacement therapy (HRT) to treat symptoms of menopause. Follow these instructions at home: Lifestyle  Do not use any products that contain nicotine or tobacco, such as cigarettes, e-cigarettes, and chewing tobacco. If you need help quitting, ask your health care provider.  Do not use street drugs.  Do not share needles.  Ask your health care provider for help if you need support or information about quitting drugs. Alcohol use  Do not drink alcohol if: ? Your health care provider tells you not to drink. ? You are pregnant, may be pregnant, or are planning to become pregnant.  If you drink alcohol: ? Limit how much you use to 0-1 drink a day. ? Limit intake if you are breastfeeding.  Be aware of how much alcohol is in your drink. In the [place] one drink equals one 12 oz bottle of beer (355 mL), one 5 oz glass of wine (148 mL), or one 1 oz glass of hard liquor (44 mL). General instructions  Schedule regular health, dental, and eye exams.  Stay current with your vaccines.  Tell your health care provider if: ? You often feel depressed. ? You have ever been abused or do not feel safe at home. Summary  Adopting a healthy lifestyle and getting preventive care are important in promoting health and  wellness.  Follow your health care provider's instructions about healthy diet, exercising, and getting tested or screened for diseases.  Follow your health care provider's instructions on monitoring your cholesterol and blood pressure. This information is not intended to replace advice given to you by your health care provider. Make sure you discuss any questions you have with your health care provider. Document Revised: 02/06/2018 Document Reviewed: 02/06/2018 Elsevier Patient Education  2020 [employer].

## 2019-05-08 NOTE — Assessment & Plan Note (Signed)
Refill xanax (rare use prn) and lexapro 10 mg daily. Well controlled overall.

## 2019-05-08 NOTE — Assessment & Plan Note (Signed)
Continue lipitor 20 mg daily, recent lipid panel at goal.

## 2019-05-08 NOTE — Progress Notes (Signed)
   Subjective:   Patient ID: Gina Goodwin, female    DOB: 01-29-1961, 59 y.o.   MRN: [id number]  HPI The patient is a 59 YO female coming in for physical.   PMH, FMH, social history reviewed and updated  Review of Systems  Constitutional: Negative.   HENT: Negative.   Eyes: Negative.   Respiratory: Negative for cough, chest tightness and shortness of breath.   Cardiovascular: Negative for chest pain, palpitations and leg swelling.  Gastrointestinal: Negative for abdominal distention, abdominal pain, constipation, diarrhea, nausea and vomiting.  Musculoskeletal: Negative.   Skin: Negative.   Neurological: Negative.   Psychiatric/Behavioral: Negative.     Objective:  Physical Exam Constitutional:      Appearance: She is well-developed.  HENT:     Head: Normocephalic and atraumatic.  Cardiovascular:     Rate and Rhythm: Normal rate and regular rhythm.  Pulmonary:     Effort: Pulmonary effort is normal. No respiratory distress.     Breath sounds: Normal breath sounds. No wheezing or rales.  Abdominal:     General: Bowel sounds are normal. There is no distension.     Palpations: Abdomen is soft.     Tenderness: There is no abdominal tenderness. There is no rebound.  Musculoskeletal:     Cervical back: Normal range of motion.  Skin:    General: Skin is warm and dry.  Neurological:     Mental Status: She is alert and oriented to person, place, and time.     Coordination: Coordination normal.     Vitals:   05/08/19 0948  BP: (!) 148/94  Pulse: 81  Temp: 99 F (37.2 C)  TempSrc: Oral  SpO2: 98%  Weight: 166 lb 2 oz (75.4 kg)  Height: 5\' 5"  (1.651 m)    This visit occurred during the SARS-CoV-2 public health emergency.  Safety protocols were in place, including screening questions prior to the visit, additional usage of staff PPE, and extensive cleaning of exam room while observing appropriate contact time as indicated for disinfecting solutions.   Assessment  & Plan:

## 2019-06-03 DIAGNOSIS — D069 Carcinoma in situ of cervix, unspecified: Secondary | ICD-10-CM | POA: Diagnosis not present

## 2019-06-03 DIAGNOSIS — N871 Moderate cervical dysplasia: Secondary | ICD-10-CM | POA: Diagnosis not present

## 2019-06-18 DIAGNOSIS — D509 Iron deficiency anemia, unspecified: Secondary | ICD-10-CM | POA: Diagnosis not present

## 2019-06-23 ENCOUNTER — Emergency Department (HOSPITAL_COMMUNITY)
Admission: EM | Admit: 2019-06-23 | Discharge: 2019-06-23 | Disposition: A | Discharge status: Home / Self Care | Payer: BC Managed Care – PPO | Attending: Emergency Medicine | Admitting: Emergency Medicine

## 2019-06-23 ENCOUNTER — Other Ambulatory Visit: Payer: Self-pay

## 2019-06-23 DIAGNOSIS — Z79899 Other long term (current) drug therapy: Secondary | ICD-10-CM | POA: Insufficient documentation

## 2019-06-23 DIAGNOSIS — N939 Abnormal uterine and vaginal bleeding, unspecified: Secondary | ICD-10-CM | POA: Insufficient documentation

## 2019-06-23 LAB — COMPREHENSIVE METABOLIC PANEL
ALT: 30 U/L (ref 0–44)
AST: 27 U/L (ref 15–41)
Albumin: 4.3 g/dL (ref 3.5–5.0)
Alkaline Phosphatase: 97 U/L (ref 38–126)
Anion gap: 13 (ref 5–15)
BUN: 13 mg/dL (ref 6–20)
CO2: 21 mmol/L — ABNORMAL LOW (ref 22–32)
Calcium: 9.2 mg/dL (ref 8.9–10.3)
Chloride: 102 mmol/L (ref 98–111)
Creatinine, Ser: 0.69 mg/dL (ref 0.44–1.00)
GFR calc Af Amer: >60 mL/min (ref >60)
GFR calc non Af Amer: >60 mL/min (ref >60)
Glucose, Bld: 135 mg/dL — ABNORMAL HIGH (ref 70–99)
Potassium: 3.7 mmol/L (ref 3.5–5.1)
Sodium: 136 mmol/L (ref 135–145)
Total Bilirubin: 1 mg/dL (ref 0.3–1.2)
Total Protein: 7.4 g/dL (ref 6.5–8.1)

## 2019-06-23 LAB — CBC
HCT: 40.7 % (ref 36.0–46.0)
Hemoglobin: 13.2 g/dL (ref 12.0–15.0)
MCH: 29.9 pg (ref 26.0–34.0)
MCHC: 32.4 g/dL (ref 30.0–36.0)
MCV: 92.1 fL (ref 80.0–100.0)
Platelets: 320 10*3/uL (ref 150–400)
RBC: 4.42 MIL/uL (ref 3.87–5.11)
RDW: 13 % (ref 11.5–15.5)
WBC: 7 10*3/uL (ref 4.0–10.5)
nRBC: 0 % (ref 0.0–0.2)

## 2019-06-23 NOTE — ED Notes (Signed)
Pt states she is going outside to sit in her friends car.

## 2019-06-23 NOTE — ED Triage Notes (Signed)
Pt had LEEP procedure on 04/06, seemd to be doing okay ( per her phys healing okay). States she coughed Friiday night and began having bright red blood/clots. Concerned that this is the result of the LEEP.

## 2019-06-23 NOTE — ED Notes (Signed)
Pt back in waiting room 

## 2019-06-23 NOTE — ED Provider Notes (Signed)
[facility] EMERGENCY DEPARTMENT Provider Note   CSN: [id number] Arrival date & time: 06/23/19  0244     History Chief Complaint  Patient presents with  . Vaginal Bleeding    Gina Goodwin is a 59 y.o. female.  Patient is a 59 year old female who had a LEEP procedure done on 06/03/2019 who reports shortly after the procedure she had to go to the office to have something put on the area due to bleeding but since that time had had improvement in the bleeding and was only wearing a panty liner.  Until Friday she reports she did cough because she had some congestion and shortly after started having heavy vaginal bleeding.  Since Friday the bleeding significantly worsened on Saturday where she was passing large clots and there was even blood sometimes running down her legs anytime she would get up and become active.  She has had some intermittent cramping in her abdomen that she states feels like period cramps but no severe pain or difficulty urinating.  No pain in her back.  No fever.  She is going through extra-large pads every 2 hours.  She does not take any anticoagulation or use aspirin.  She spoke with her doctor on the phone who recommended she come to the emergency room.  Symptoms have not significantly changed since waiting to be seen.  The history is provided by the patient.  Vaginal Bleeding Quality:  Bright red, clots, dark red and heavier than menses Severity:  Severe Onset quality:  Gradual Duration:  3 days Timing:  Constant Progression:  Worsening Chronicity:  New Number of pads used:  Large pad every 2 hours Possible pregnancy: no   Context: spontaneously   Relieved by:  Nothing Worsened by:  Activity Ineffective treatments:  None tried Associated symptoms: no back pain, no dizziness, no fever, no nausea and no vaginal discharge   Associated symptoms comment:  Intermittent abd cramping Risk factors: no bleeding disorder   Risk factors comment:  No  anticoagulation      Past Medical History:  Diagnosis Date  . Adjustment disorder with anxious mood 07/07/2016  . Chest heaviness 05/14/2014  . Cough 07/11/2017  . Cyst of skin 10/28/2015  . Dyslipidemia   . Hearing loss    wears aide on Left side  . Hyperlipidemia   . Overweight 05/11/2010   Qualifier: Diagnosis of  By: Asa Lente MD, Jannifer Rodney Psoriasis   . PSORIASIS 09/28/2009   Qualifier: Diagnosis of  By: Asa Lente MD, Jannifer Rodney     Patient Active Problem List   Diagnosis Date Noted  . Routine general medical examination at a health care facility 01/09/2018  . Adjustment disorder with anxious mood 07/07/2016  . Hyperlipidemia 09/28/2009  . PSORIASIS 09/28/2009    Past Surgical History:  Procedure Laterality Date  . None       OB History   No obstetric history on file.     Family History  Problem Relation Age of Onset  . Hyperlipidemia Father   . Heart disease Father   . Breast cancer Other        Breast - 1st degree relative <50    Social History   Tobacco Use  . Smoking status: Never Smoker  . Smokeless tobacco: Never Used  Substance Use Topics  . Alcohol use: Yes    Comment: Occasional   . Drug use: Not on file    Home Medications Prior to Admission medications   Medication  Sig Start Date End Date Taking? Authorizing Provider  ALPRAZolam Prudy Feeler) 0.5 MG tablet Take 1 tablet (0.5 mg total) by mouth daily as needed. 05/08/19   Myrlene Broker, MD  atorvastatin (LIPITOR) 20 MG tablet Take 1 tablet (20 mg total) by mouth daily. 03/26/19   Croitoru, Mihai, MD  Calcium Carbonate-Vitamin D (CALTRATE 600+D) 600-400 MG-UNIT per tablet Take 1 tablet by mouth daily.    [provider]  escitalopram (LEXAPRO) 10 MG tablet Take 1 tablet (10 mg total) by mouth daily. 05/08/19   Myrlene Broker, MD    Allergies    Patient has no known allergies.  Review of Systems   Review of Systems  Constitutional: Negative for fever.  Gastrointestinal:  Negative for nausea.  Genitourinary: Positive for vaginal bleeding. Negative for vaginal discharge.  Musculoskeletal: Negative for back pain.  Neurological: Negative for dizziness.  All other systems reviewed and are negative.   Physical Exam Updated Vital Signs BP (!) 159/85 (BP Location: Left Arm)   Pulse 85   Temp 98.3 F (36.8 C) (Oral)   Resp 18   SpO2 100%   Physical Exam Vitals and nursing note reviewed.  Constitutional:      General: She is not in acute distress.    Appearance: Normal appearance. She is well-developed and normal weight.  HENT:     Head: Normocephalic and atraumatic.  Eyes:     Pupils: Pupils are equal, round, and reactive to light.  Cardiovascular:     Rate and Rhythm: Normal rate and regular rhythm.     Heart sounds: Normal heart sounds. No murmur. No friction rub.  Pulmonary:     Effort: Pulmonary effort is normal.     Breath sounds: Normal breath sounds. No wheezing or rales.  Abdominal:     General: Bowel sounds are normal. There is no distension.     Palpations: Abdomen is soft.     Tenderness: There is no abdominal tenderness. There is no guarding or rebound.  Genitourinary:    Comments: A clot present at the cervical os which was removed.  Generalized oozing from patient's recent surgical procedure but no acute severe bleeding is noted.  Fresh red blood is present in the vaginal vault.  Only mild oozing from the cervical os with pressure placed on the uterus. Musculoskeletal:        General: No tenderness. Normal range of motion.     Comments: No edema  Skin:    General: Skin is warm and dry.     Findings: No rash.  Neurological:     General: No focal deficit present.     Mental Status: She is alert and oriented to person, place, and time. Mental status is at baseline.     Cranial Nerves: No cranial nerve deficit.  Psychiatric:        Mood and Affect: Mood normal.        Behavior: Behavior normal.        Thought Content: Thought  content normal.     ED Results / Procedures / Treatments   Labs (all labs ordered are listed, but only abnormal results are displayed) Labs Reviewed  COMPREHENSIVE METABOLIC PANEL - Abnormal; Notable for the following components:      Result Value   CO2 21 (*)    Glucose, Bld 135 (*)    All other components within normal limits  CBC    EKG None  Radiology No results found.  Procedures Procedures (including critical care  time)  Medications Ordered in ED Medications - No data to display  ED Course  I have reviewed the triage vital signs and the nursing notes.  Pertinent labs & imaging results that were available during my care of the patient were reviewed by me and considered in my medical decision making (see chart for details).    MDM Rules/Calculators/A&P                      Patient presenting today with heavy vaginal bleeding that started over the weekend.  Of note she did have a LEEP procedure on 06/03/2019.  Patient does not take anticoagulation.  Patient's hemoglobin today is stable at 13 and heart rate and blood pressure without tachycardia or hypotension.  On exam patient has minimal abdominal pain and does have ongoing bleeding.  She did have a clot present at her cervical os which was removed and just displayed ongoing oozing of blood.  Spoke with [facility] office where patient is seeing Dr. Langston Masker.  They recommended that patient come to the office at 1030 4 repeat evaluation with Dr. Elon Spanner.  Patient is otherwise stable here in stable for discharge and follow-up with her doctor in about an hour.  Final Clinical Impression(s) / ED Diagnoses Final diagnoses:  Vaginal bleeding    Rx / DC Orders ED Discharge Orders    None       Gwyneth Sprout, MD 06/23/19 [phone]

## 2019-06-25 DIAGNOSIS — Z09 Encounter for follow-up examination after completed treatment for conditions other than malignant neoplasm: Secondary | ICD-10-CM | POA: Diagnosis not present

## 2019-06-25 DIAGNOSIS — N72 Inflammatory disease of cervix uteri: Secondary | ICD-10-CM | POA: Diagnosis not present

## 2019-07-30 ENCOUNTER — Telehealth (HOSPITAL_COMMUNITY): Payer: Self-pay | Admitting: Cardiovascular Disease

## 2019-07-30 NOTE — Telephone Encounter (Signed)
I called patient to schedule GXT and it is scheduled for 09/02/19 in hopes that quarantine guidelines will be lifted. 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  If patient needs to quarantine before test she will have to cancel test until guidelines change. She states she had COVID in November and has been vaccinated and she is totally understanding of the COVID Guideline per [facility].

## 2019-07-31 DIAGNOSIS — L814 Other melanin hyperpigmentation: Secondary | ICD-10-CM | POA: Diagnosis not present

## 2019-07-31 DIAGNOSIS — L281 Prurigo nodularis: Secondary | ICD-10-CM | POA: Diagnosis not present

## 2019-07-31 DIAGNOSIS — B353 Tinea pedis: Secondary | ICD-10-CM | POA: Diagnosis not present

## 2019-07-31 DIAGNOSIS — Z808 Family history of malignant neoplasm of other organs or systems: Secondary | ICD-10-CM | POA: Diagnosis not present

## 2019-07-31 DIAGNOSIS — D2272 Melanocytic nevi of left lower limb, including hip: Secondary | ICD-10-CM | POA: Diagnosis not present

## 2019-08-28 ENCOUNTER — Telehealth (HOSPITAL_COMMUNITY): Payer: Self-pay

## 2019-08-28 NOTE — Telephone Encounter (Signed)
Encounter complete. 

## 2019-09-02 ENCOUNTER — Other Ambulatory Visit: Payer: Self-pay

## 2019-09-02 ENCOUNTER — Ambulatory Visit (HOSPITAL_COMMUNITY)
Admission: RE | Admit: 2019-09-02 | Discharge: 2019-09-02 | Disposition: A | Discharge status: Home / Self Care | Payer: No Typology Code available for payment source | Source: Ambulatory Visit | Attending: Cardiology | Admitting: Cardiology

## 2019-09-02 DIAGNOSIS — Z8249 Family history of ischemic heart disease and other diseases of the circulatory system: Secondary | ICD-10-CM | POA: Diagnosis not present

## 2019-09-02 DIAGNOSIS — Z136 Encounter for screening for cardiovascular disorders: Secondary | ICD-10-CM | POA: Diagnosis present

## 2019-09-02 LAB — EXERCISE TOLERANCE TEST
Estimated workload: 12.3 METS
Exercise duration (min): 10 min
Exercise duration (sec): 24 s
MPHR: 165 {beats}/min
Peak HR: 181 {beats}/min
Percent HR: 112 %
Rest HR: 70 {beats}/min

## 2019-12-17 ENCOUNTER — Other Ambulatory Visit: Payer: Self-pay | Admitting: Obstetrics & Gynecology

## 2019-12-17 DIAGNOSIS — Z1231 Encounter for screening mammogram for malignant neoplasm of breast: Secondary | ICD-10-CM

## 2020-01-06 ENCOUNTER — Other Ambulatory Visit: Payer: Self-pay | Admitting: Cardiovascular Disease

## 2020-01-06 DIAGNOSIS — E782 Mixed hyperlipidemia: Secondary | ICD-10-CM

## 2020-03-02 ENCOUNTER — Ambulatory Visit
Admission: RE | Admit: 2020-03-02 | Discharge: 2020-03-02 | Disposition: A | Discharge status: Home / Self Care | Payer: No Typology Code available for payment source | Source: Ambulatory Visit | Attending: Obstetrics & Gynecology | Admitting: Obstetrics & Gynecology

## 2020-03-02 ENCOUNTER — Other Ambulatory Visit: Payer: Self-pay

## 2020-03-02 DIAGNOSIS — Z1231 Encounter for screening mammogram for malignant neoplasm of breast: Secondary | ICD-10-CM

## 2020-03-03 ENCOUNTER — Encounter: Payer: Self-pay | Admitting: Internal Medicine

## 2020-03-23 ENCOUNTER — Telehealth: Payer: No Typology Code available for payment source | Admitting: Physician Assistant

## 2020-03-23 NOTE — Progress Notes (Signed)
Virtual Visit via Telephone Note   This visit type was conducted due to national recommendations for restrictions regarding the COVID-19 Pandemic (e.g. social distancing) in an effort to limit this patient's exposure and mitigate transmission in our community.  Due to her co-morbid illnesses, this patient is at least at moderate risk for complications without adequate follow up.  This format is felt to be most appropriate for this patient at this time.  The patient did not have access to video technology/had technical difficulties with video requiring transitioning to audio format only (telephone).  All issues noted in this document were discussed and addressed.  No physical exam could be performed with this format.  Please refer to the patient's chart for her  consent to telehealth for [facility].    Date:  03/24/2020   ID:  Gina Goodwin, DOB 24-Aug-1960, MRN [id number] The patient was identified using 2 identifiers.  Patient Location: Other:  work Provider Location: Office/Clinic  PCP:  Myrlene Broker, MD  Cardiologist:  Thurmon Fair, MD  Electrophysiologist:  None   Evaluation Performed:  Follow-Up Visit  Chief Complaint:  HLD, family history of CAD  History of Present Illness:    Gina Goodwin is a 60 y.o. female with a hx of hyperlipidemia and a strong family history of premature death from coronary artery disease.  She follows with Dr. Royann Shivers.  She was last seen in December 2020 and reported recurrent episodes of chest pain.  POET was nonischemic.   She presents today for routine follow-up.  She works as a Glass blower/designer at [employer]. She denies chest pain and shortness of breath. She is not exercising now, but does take stairs and walks the hospital. Overall she is doing well from a cardiac perspective. She does snore, but no daytime drowsiness or waking at night gasping for air.    The patient does not have symptoms concerning for COVID-19 infection (fever,  chills, cough, or new shortness of breath).    Past Medical History:  Diagnosis Date  . Adjustment disorder with anxious mood 07/07/2016  . Chest heaviness 05/14/2014  . Cough 07/11/2017  . Cyst of skin 10/28/2015  . Dyslipidemia   . Hearing loss    wears aide on Left side  . Hyperlipidemia   . Overweight 05/11/2010   Qualifier: Diagnosis of  By: Felicity Coyer MD, Raenette Rover Psoriasis   . PSORIASIS 09/28/2009   Qualifier: Diagnosis of  By: Felicity Coyer MD, Raenette Rover    Past Surgical History:  Procedure Laterality Date  . None       No outpatient medications have been marked as taking for the 03/24/20 encounter (Video Visit) with Marcelino Duster, PA.     Allergies:   Patient has no known allergies.   Social History   Tobacco Use  . Smoking status: Never Smoker  . Smokeless tobacco: Never Used  Substance Use Topics  . Alcohol use: Yes    Comment: Occasional      Family Hx: The patient's family history includes Breast cancer in her maternal aunt, maternal grandmother, and another family member; Heart disease in her father; Hyperlipidemia in her father.  ROS:   Please see the history of present illness.     All other systems reviewed and are negative.   Prior CV studies:   The following studies were reviewed today:  ETT 2021  Blood pressure demonstrated a normal response to exercise.  Horizontal ST segment depression ST segment depression was  noted during stress in the V5, V6 and aVF leads, and returning to baseline after less than 1 minute of recovery.   Excellent exercise capacity, without symptoms. At/near peak exercise, there is a PVC triplet. There is also 1 mm horizontal ST depressions in V5, V6, and aVF. Overall Gina Goodwin treadmill score is +5, low risk, but not a completely normal study.   Labs/Other Tests and Data Reviewed:    EKG:  No ECG reviewed.  Recent Labs: 06/23/2019: ALT 30; BUN 13; Creatinine, Ser 0.69; Hemoglobin 13.2; Platelets 320; Potassium 3.7;  Sodium 136   Recent Lipid Panel Lab Results  Component Value Date/Time   CHOL 202 (H) 05/01/2019 11:33 AM   TRIG 54 05/01/2019 11:33 AM   TRIG 88 08/27/2009 12:00 AM   HDL 79 05/01/2019 11:33 AM   CHOLHDL 2.6 05/01/2019 11:33 AM   CHOLHDL 3 07/14/2016 12:30 PM   LDLCALC 113 (H) 05/01/2019 11:33 AM   LDLDIRECT 142.0 06/03/2012 10:44 AM    Wt Readings from Last 3 Encounters:  03/24/20 160 lb (72.6 kg)  05/08/19 166 lb 2 oz (75.4 kg)  01/28/19 163 lb (73.9 kg)     Risk Assessment/Calculations:      Objective:    Vital Signs:  BP 120/78 (BP Location: Left Arm, Patient Position: Sitting)   Ht 5\' 5"  (1.651 m)   Wt 160 lb (72.6 kg)   BMI 26.63 kg/m    VITAL SIGNS:  reviewed GEN:  no acute distress RESPIRATORY:  unlabored NEURO:  alert and oriented x 3, no obvious focal deficit PSYCH:  normal affect  ASSESSMENT & PLAN:    Hyperlipidemia with LDL goal less than 70 05/01/2019: Cholesterol, Total 202; HDL 79; LDL Chol Calc (NIH) 113; Triglycerides 54 - she is taking 20 mg lipitor  - will check a fasting lipid panel and LFTs   Chest pain History of premature CAD (father, paternal grandfather) - Nonischemic POET - denies current chest pain, can complete more than 4.0 METS   Exercise - her [place] resolution is to exercise more   COVID-19 Education: The signs and symptoms of COVID-19 were discussed with the patient and how to seek care for testing (follow up with PCP or arrange E-visit).  The importance of social distancing was discussed today.  Time:   Today, I have spent 15 minutes with the patient with telehealth technology discussing the above problems.     Medication Adjustments/Labs and Tests Ordered: Current medicines are reviewed at length with the patient today.  Concerns regarding medicines are outlined above.   Tests Ordered: Orders Placed This Encounter  Procedures  . Lipid panel  . Hepatic function panel    Medication Changes: No orders of the defined  types were placed in this encounter.   Follow Up:  In Person in 1 year(s). OK for VV  Signed, 07/01/2019, PA  03/24/2020 12:35 PM    [facility]

## 2020-03-24 ENCOUNTER — Telehealth (INDEPENDENT_AMBULATORY_CARE_PROVIDER_SITE_OTHER): Payer: No Typology Code available for payment source | Admitting: Physician Assistant

## 2020-03-24 ENCOUNTER — Encounter: Payer: Self-pay | Admitting: Physician Assistant

## 2020-03-24 VITALS — BP 120/78 | Ht 65.0 in | Wt 160.0 lb

## 2020-03-24 DIAGNOSIS — E7800 Pure hypercholesterolemia, unspecified: Secondary | ICD-10-CM | POA: Diagnosis not present

## 2020-03-24 DIAGNOSIS — Z8249 Family history of ischemic heart disease and other diseases of the circulatory system: Secondary | ICD-10-CM | POA: Diagnosis not present

## 2020-03-24 DIAGNOSIS — R079 Chest pain, unspecified: Secondary | ICD-10-CM

## 2020-03-24 NOTE — Patient Instructions (Signed)
Medication Instructions:  No Changes *If you need a refill on your cardiac medications before your next appointment, please call your pharmacy*   Lab Work: Lipid Panel, Hepatic Panel If you have labs (blood work) drawn today and your tests are completely normal, you will receive your results only by: [place] MyChart Message (if you have MyChart) OR . A paper copy in the mail If you have any lab test that is abnormal or we need to change your treatment, we will call you to review the results.   Testing/Procedures: No Testing    Follow-Up: At [facility], you and your health needs are our priority.  As part of our continuing mission to provide you with exceptional heart care, we have created designated Provider Care Teams.  These Care Teams include your primary Cardiologist (physician) and Advanced Practice Providers (APPs -  Physician Assistants and Nurse Practitioners) who all work together to provide you with the care you need, when you need it.     Your next appointment:   1 year(s)  The format for your next appointment:   In Person  Provider:   Thurmon Fair, MD

## 2020-05-10 ENCOUNTER — Other Ambulatory Visit: Payer: Self-pay | Admitting: Internal Medicine

## 2020-05-10 DIAGNOSIS — F4322 Adjustment disorder with anxiety: Secondary | ICD-10-CM

## 2020-06-18 ENCOUNTER — Other Ambulatory Visit: Payer: Self-pay | Admitting: Physician Assistant

## 2020-06-18 DIAGNOSIS — E782 Mixed hyperlipidemia: Secondary | ICD-10-CM

## 2020-06-18 LAB — LIPID PANEL
Chol/HDL Ratio: 3.9 ratio (ref 0.0–4.4)
Cholesterol, Total: 267 mg/dL — ABNORMAL HIGH (ref 100–199)
HDL: 69 mg/dL (ref >39)
LDL Chol Calc (NIH): 178 mg/dL — ABNORMAL HIGH (ref 0–99)
Triglycerides: 113 mg/dL (ref 0–149)
VLDL Cholesterol Cal: 20 mg/dL (ref 5–40)

## 2020-06-18 LAB — HEPATIC FUNCTION PANEL
ALT: 26 IU/L (ref 0–32)
AST: 19 IU/L (ref 0–40)
Albumin: 4.3 g/dL (ref 3.8–4.9)
Alkaline Phosphatase: 104 IU/L (ref 44–121)
Bilirubin Total: 0.4 mg/dL (ref 0.0–1.2)
Bilirubin, Direct: <0.1 mg/dL (ref 0.00–0.40)
Total Protein: 7.3 g/dL (ref 6.0–8.5)

## 2020-06-18 MED ORDER — ATORVASTATIN CALCIUM 80 MG PO TABS: 80.0000 mg | ORAL_TABLET | Freq: Every day | ORAL | 3 refills | Status: DC

## 2020-06-21 ENCOUNTER — Telehealth: Payer: Self-pay | Admitting: Cardiovascular Disease

## 2020-06-21 DIAGNOSIS — E782 Mixed hyperlipidemia: Secondary | ICD-10-CM

## 2020-06-21 NOTE — Telephone Encounter (Signed)
Spoke to the patient and she has been made aware of Dr. Erin Hearing response. She would like to clarify about the Atorvastatin dosage.

## 2020-06-21 NOTE — Telephone Encounter (Signed)
Patient calling for lab results. She is having trouble getting into mychart.

## 2020-06-21 NOTE — Telephone Encounter (Signed)
Reviewed the lab results. There has indeed been a marked increase in the total cholesterol and the LDL cholesterol, compared to all of the previous labs for several years in the past. This would suggest me that there is been a big change in Mrs. Korpela's diet or exercise habits.  It is quite possible that COVID may have had something to do with it.  Based on the recent calcium score of 0 and normal stress test last year, I do not think we need to imminently start a statin, but I would repeat the lipid profile in 6 months to make sure she is back to her previous baseline.

## 2020-06-21 NOTE — Telephone Encounter (Addendum)
Gina Goodwin [place], [place]  06/18/2020 10:21 AM EDT      Your cholesterol is now significantly worse than 1 year ago. LDL (bad cholesterol) has increased from 113 to 178. Given your family history, I think your LDL should be under 70. I think we need to increase your lipitor to 80 mg for better control. This will also help your triglycerides, which were also elevated from one year ago.     Patient aware of results and verbalized understanding.   Rx was sent to pharmacy.     Will follow up with PA to see when recheck needed and mail lab slips.    Patient aware.       Patient called back and wonders if having Covid can have any effects on the cholesterol readings.   She had Covid November 2020, this is the only significant change she can think of since last check.   She would like to get Dr. Royann Shivers recommendations as well.    Routed to MD

## 2020-06-21 NOTE — Telephone Encounter (Signed)
Sorry, my miscommunication. I would recommend increasing the atorvastatin to 40 mg daily and recheck a lipid profile in 3-6 months.

## 2020-06-22 MED ORDER — ATORVASTATIN CALCIUM 40 MG PO TABS: 40.0000 mg | ORAL_TABLET | Freq: Every day | ORAL | 3 refills | Status: DC

## 2020-06-22 NOTE — Telephone Encounter (Signed)
Patient has been made aware. Lab orders placed. 

## 2020-12-17 ENCOUNTER — Other Ambulatory Visit: Payer: Self-pay | Admitting: Obstetrics & Gynecology

## 2020-12-17 DIAGNOSIS — Z1231 Encounter for screening mammogram for malignant neoplasm of breast: Secondary | ICD-10-CM

## 2021-01-12 ENCOUNTER — Other Ambulatory Visit: Payer: Self-pay

## 2021-01-12 DIAGNOSIS — E782 Mixed hyperlipidemia: Secondary | ICD-10-CM

## 2021-01-13 LAB — LIPID PANEL
Chol/HDL Ratio: 3.6 ratio (ref 0.0–4.4)
Cholesterol, Total: 229 mg/dL — ABNORMAL HIGH (ref 100–199)
HDL: 64 mg/dL (ref >39)
LDL Chol Calc (NIH): 144 mg/dL — ABNORMAL HIGH (ref 0–99)
Triglycerides: 117 mg/dL (ref 0–149)
VLDL Cholesterol Cal: 21 mg/dL (ref 5–40)

## 2021-01-16 ENCOUNTER — Encounter: Payer: Self-pay | Admitting: Cardiovascular Disease

## 2021-01-16 DIAGNOSIS — E7800 Pure hypercholesterolemia, unspecified: Secondary | ICD-10-CM

## 2021-01-17 MED ORDER — EZETIMIBE 10 MG PO TABS: 10.0000 mg | ORAL_TABLET | Freq: Every day | ORAL | 3 refills | Status: DC

## 2021-03-03 ENCOUNTER — Other Ambulatory Visit: Payer: Self-pay

## 2021-03-03 ENCOUNTER — Ambulatory Visit
Admission: RE | Admit: 2021-03-03 | Discharge: 2021-03-03 | Disposition: A | Discharge status: Home / Self Care | Payer: No Typology Code available for payment source | Source: Ambulatory Visit | Attending: Obstetrics & Gynecology | Admitting: Obstetrics & Gynecology

## 2021-03-03 DIAGNOSIS — Z1231 Encounter for screening mammogram for malignant neoplasm of breast: Secondary | ICD-10-CM

## 2021-03-22 ENCOUNTER — Other Ambulatory Visit: Payer: Self-pay | Admitting: Cardiovascular Disease

## 2021-03-23 LAB — LIPID PANEL
Chol/HDL Ratio: 2.7 ratio (ref 0.0–4.4)
Cholesterol, Total: 182 mg/dL (ref 100–199)
HDL: 68 mg/dL (ref >39)
LDL Chol Calc (NIH): 97 mg/dL (ref 0–99)
Triglycerides: 93 mg/dL (ref 0–149)
VLDL Cholesterol Cal: 17 mg/dL (ref 5–40)

## 2021-04-01 ENCOUNTER — Encounter: Payer: Self-pay | Admitting: Cardiovascular Disease

## 2021-04-01 MED ORDER — ATORVASTATIN CALCIUM 40 MG PO TABS: 40.0000 mg | ORAL_TABLET | Freq: Every day | ORAL | 3 refills | Status: DC

## 2021-04-02 IMAGING — MG DIGITAL SCREENING BILAT W/ TOMO W/ CAD
8 series · 8 of 24 positions shown · non-contrast
Comparison: Previous exam(s).

CLINICAL DATA: Screening.

EXAM:
DIGITAL SCREENING BILATERAL MAMMOGRAM WITH TOMO AND CAD

[R CC synth-2D]
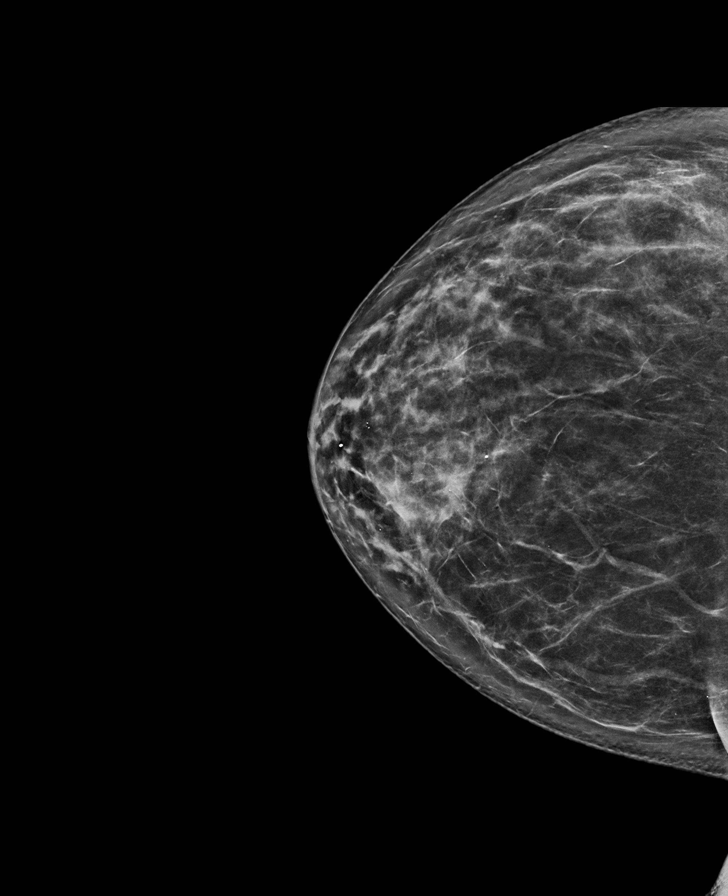

[R MLO synth-2D]
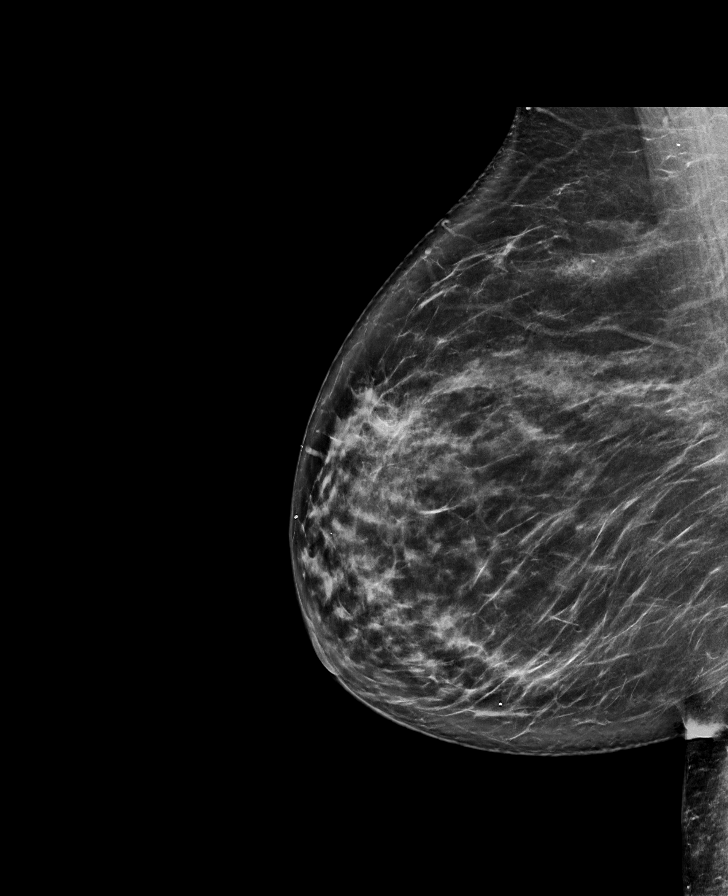

[L CC synth-2D]
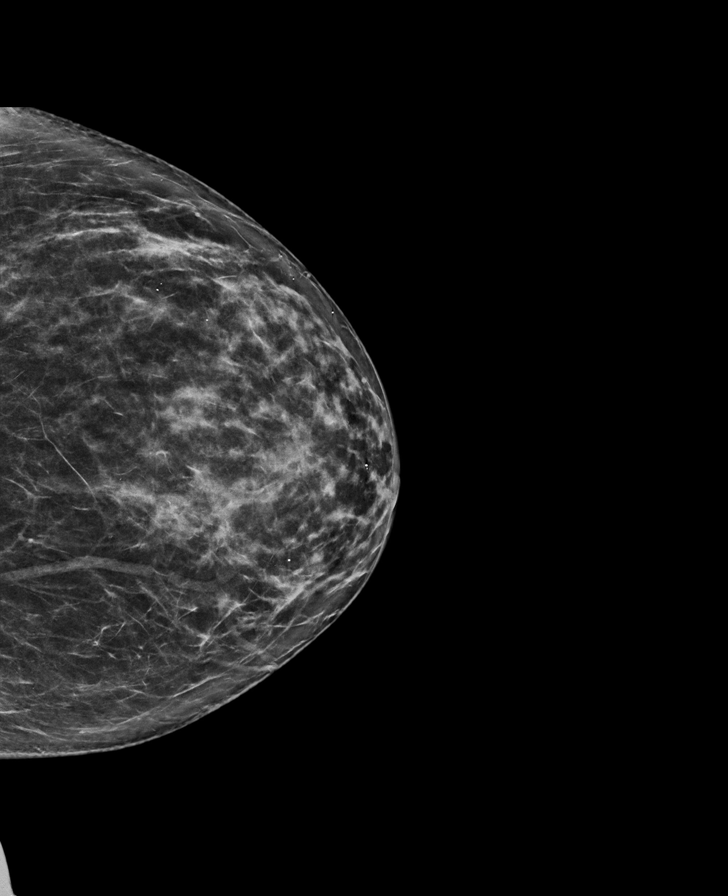

[L MLO synth-2D]
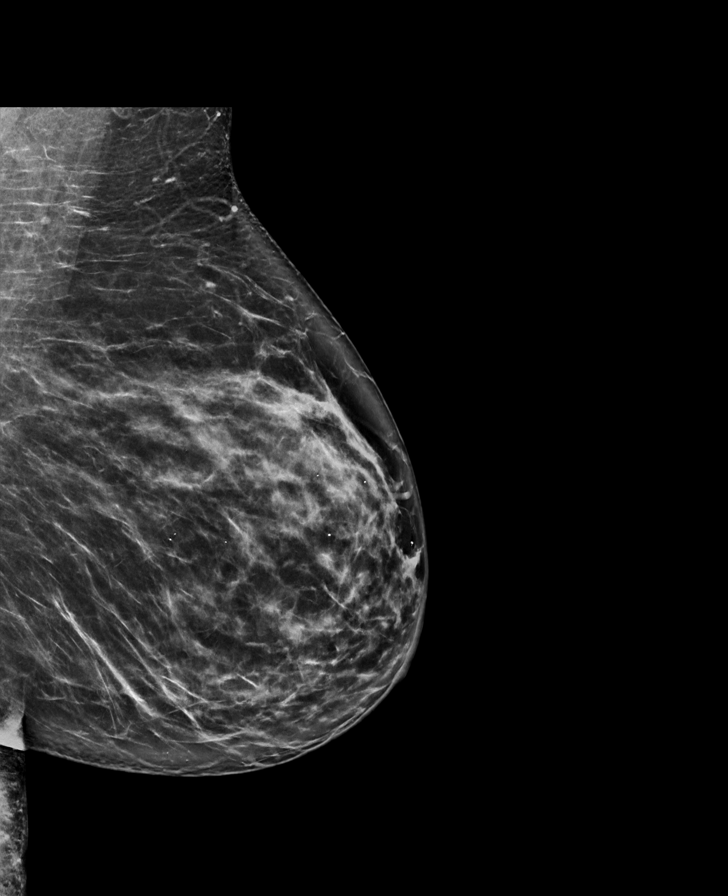

[L MLO tomo · tomo slice 43/84.0]
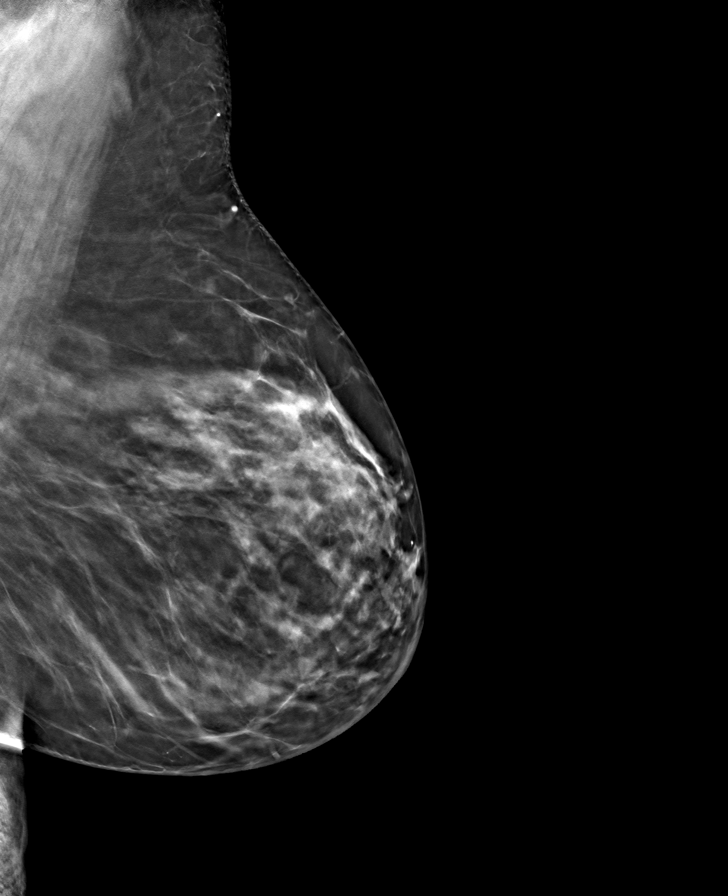

[L CC tomo · tomo slice 39/77.0]
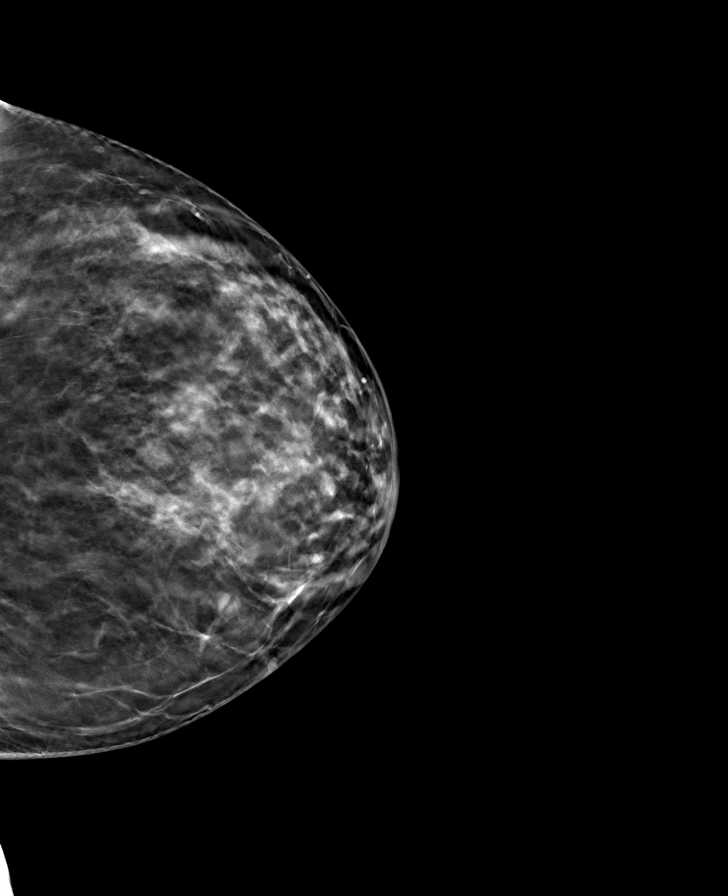

[R MLO tomo · tomo slice 46/91.0]
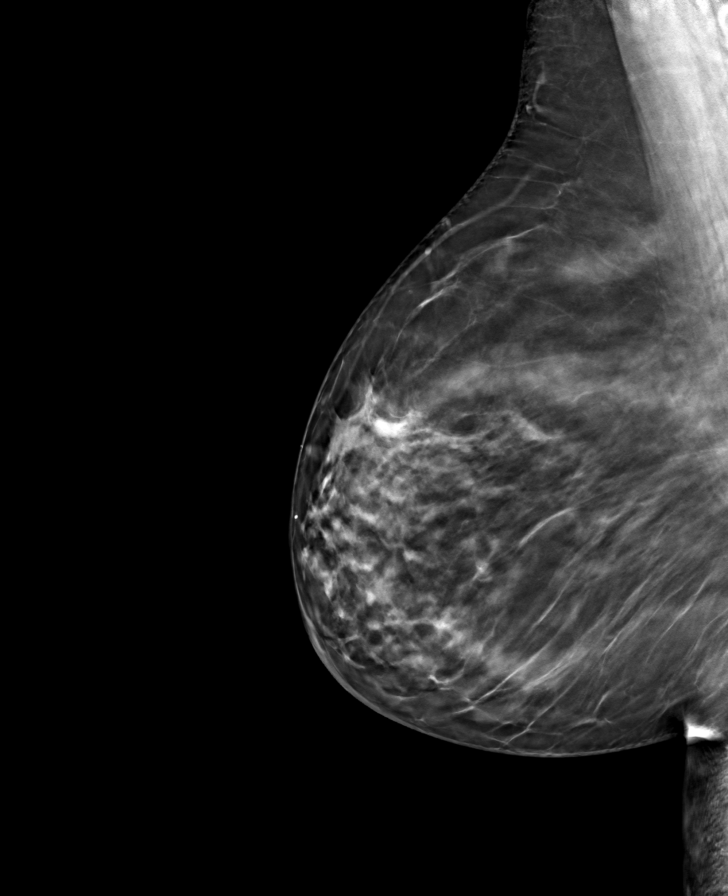

[R CC tomo · tomo slice 43/84.0]
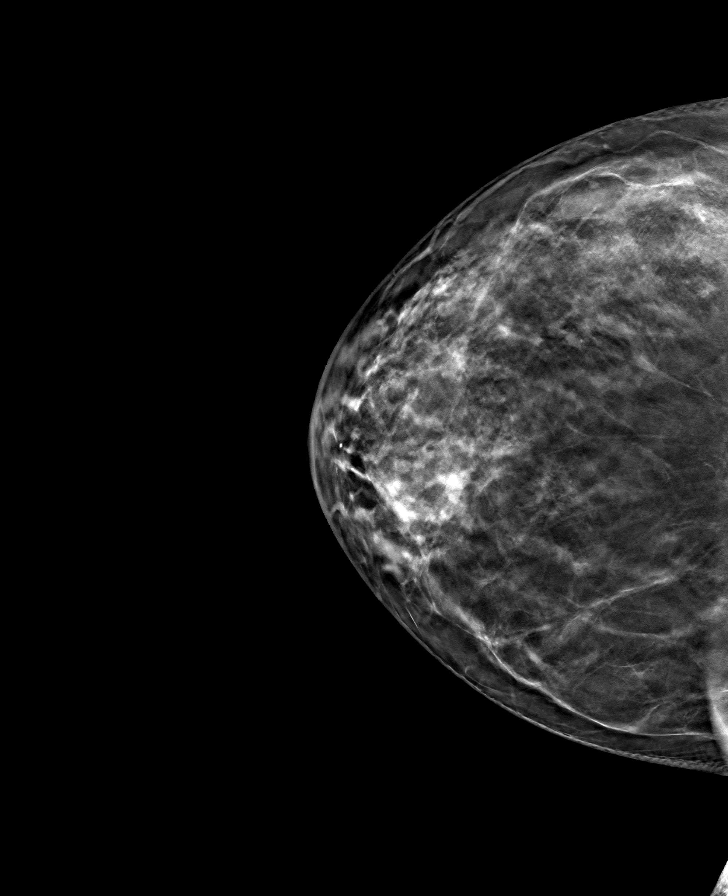

[8 of 24 positions shown; findings below may reference images not displayed]

ACR Breast Density Category c: The breast tissue is heterogeneously
dense, which may obscure small masses.
FINDINGS: There are no findings suspicious for malignancy. Images were
processed with CAD.
IMPRESSION: No mammographic evidence of malignancy. A result letter of this
screening mammogram will be mailed directly to the patient.

RECOMMENDATION:
Screening mammogram in one year. (Code:[id number])

BI-RADS CATEGORY  1: Negative.

## 2021-05-04 ENCOUNTER — Encounter: Payer: Self-pay | Admitting: Cardiovascular Disease

## 2021-05-04 ENCOUNTER — Encounter: Payer: Self-pay | Admitting: Internal Medicine

## 2021-06-01 ENCOUNTER — Telehealth: Payer: Self-pay | Admitting: Internal Medicine

## 2021-06-01 NOTE — Telephone Encounter (Signed)
Pt needs to know the best way to discontinue LexaPRO.  ? ?Pt states she feels like it has caused her to gain weight and wants to wean off of it.  ?

## 2021-06-02 ENCOUNTER — Encounter: Payer: Self-pay | Admitting: Internal Medicine

## 2021-06-02 NOTE — Telephone Encounter (Signed)
See my chart message

## 2021-06-02 NOTE — Telephone Encounter (Signed)
Okay to go to 1/2 pill daily for 1 month then 1/2 pill every other day for 2 weeks then stop. ?

## 2021-06-21 ENCOUNTER — Other Ambulatory Visit: Payer: Self-pay | Admitting: Cardiovascular Disease

## 2021-06-23 ENCOUNTER — Other Ambulatory Visit (HOSPITAL_COMMUNITY): Payer: Self-pay

## 2021-06-23 MED ORDER — EZETIMIBE 10 MG PO TABS
10.0000 mg | ORAL_TABLET | Freq: Every day | ORAL | 0 refills | Status: DC
  Filled 2021-06-23 – 2021-06-25 (×2): qty 90, 90d supply, fill #0

## 2021-06-25 ENCOUNTER — Other Ambulatory Visit (HOSPITAL_COMMUNITY): Payer: Self-pay

## 2021-08-15 ENCOUNTER — Encounter: Payer: Self-pay | Admitting: Internal Medicine

## 2022-01-06 ENCOUNTER — Telehealth: Payer: Self-pay | Admitting: Cardiovascular Disease

## 2022-01-06 MED ORDER — EZETIMIBE 10 MG PO TABS: 10.0000 mg | ORAL_TABLET | Freq: Every day | ORAL | 0 refills | Status: DC

## 2022-01-06 NOTE — Telephone Encounter (Signed)
*  STAT* If patient is at the pharmacy, call can be transferred to refill team.   1. Which medications need to be refilled? (please list name of each medication and dose if known)   ezetimibe (ZETIA) 10 MG tablet (Expired)   2. Which pharmacy/location (including street and city if local pharmacy) is medication to be sent to?  [facility] - [place], [place] - [address]   3. Do they need a 30 day or 90 day supply? 90 day  Patient stated she is completely out of this medication and has been out for a week.

## 2022-02-06 ENCOUNTER — Other Ambulatory Visit: Payer: Self-pay | Admitting: Obstetrics & Gynecology

## 2022-02-06 DIAGNOSIS — Z1231 Encounter for screening mammogram for malignant neoplasm of breast: Secondary | ICD-10-CM

## 2022-03-10 ENCOUNTER — Encounter: Payer: Self-pay | Admitting: Cardiovascular Disease

## 2022-03-18 ENCOUNTER — Other Ambulatory Visit: Payer: Self-pay | Admitting: Cardiovascular Disease

## 2022-03-21 MED ORDER — ATORVASTATIN CALCIUM 40 MG PO TABS: 40.0000 mg | ORAL_TABLET | Freq: Every day | ORAL | 0 refills | Status: DC

## 2022-03-23 ENCOUNTER — Telehealth: Payer: Self-pay | Admitting: Cardiovascular Disease

## 2022-03-23 NOTE — Telephone Encounter (Signed)
Pt c/o medication issue:  1. Name of Medication:  atorvastatin (LIPITOR) 40 MG tablet  2. How are you currently taking this medication (dosage and times per day)?   3. Are you having a reaction (difficulty breathing--STAT)?   4. What is your medication issue?   Rep from [facility] would like to clarify instructions.

## 2022-03-24 MED ORDER — ATORVASTATIN CALCIUM 40 MG PO TABS: 40.0000 mg | ORAL_TABLET | Freq: Every day | ORAL | 3 refills | Status: DC

## 2022-03-24 NOTE — Telephone Encounter (Addendum)
Per chart review, patient just had lab work done. No med changes based on this. Rx for atorvastatin 40mg  was sent in on 03/21/22 for a 30 day supply. Patient is overdue for visit - scheduled with Dr. Loletha Grayer in February.   Spoke with [employer]. Was informed another RN called them last night to verify that patient is taking atorvastatin 40mg  daily - did not see documentation in chart. Patient was only given a 30 day supply for last refill but her [facility], insurance prefers 90 days. Rx(s) sent to pharmacy electronically.

## 2022-04-03 ENCOUNTER — Ambulatory Visit: Payer: No Typology Code available for payment source

## 2022-04-03 ENCOUNTER — Other Ambulatory Visit: Payer: Self-pay | Admitting: Cardiovascular Disease

## 2022-04-05 ENCOUNTER — Ambulatory Visit
Admission: RE | Admit: 2022-04-05 | Discharge: 2022-04-05 | Disposition: A | Discharge status: Home / Self Care | Payer: No Typology Code available for payment source | Source: Ambulatory Visit | Attending: Obstetrics & Gynecology | Admitting: Obstetrics & Gynecology

## 2022-04-05 DIAGNOSIS — Z1231 Encounter for screening mammogram for malignant neoplasm of breast: Secondary | ICD-10-CM

## 2022-04-07 ENCOUNTER — Ambulatory Visit
Payer: No Typology Code available for payment source | Attending: Cardiovascular Disease | Admitting: Cardiovascular Disease

## 2022-04-07 ENCOUNTER — Encounter: Payer: Self-pay | Admitting: Cardiovascular Disease

## 2022-04-07 VITALS — BP 164/96 | HR 73 | Ht 65.0 in | Wt 163.8 lb

## 2022-04-07 DIAGNOSIS — R030 Elevated blood-pressure reading, without diagnosis of hypertension: Secondary | ICD-10-CM | POA: Diagnosis not present

## 2022-04-07 DIAGNOSIS — E7800 Pure hypercholesterolemia, unspecified: Secondary | ICD-10-CM

## 2022-04-07 NOTE — Patient Instructions (Signed)
Medication Instructions:  NO CHANGES  *If you need a refill on your cardiac medications before your next appointment, please call your pharmacy*   Lab Work: FASTING lab work to check cholesterol   If you have labs (blood work) drawn today and your tests are completely normal, you will receive your results only by: [place] (if you have MyChart) OR A paper copy in the mail If you have any lab test that is abnormal or we need to change your treatment, we will call you to review the results.    Follow-Up: At [facility], you and your health needs are our priority.  As part of our continuing mission to provide you with exceptional heart care, we have created designated Provider Care Teams.  These Care Teams include your primary Cardiologist (physician) and Advanced Practice Providers (APPs -  Physician Assistants and Nurse Practitioners) who all work together to provide you with the care you need, when you need it.  We recommend signing up for the patient portal called "MyChart".  Sign up information is provided on this After Visit Summary.  MyChart is used to connect with patients for Virtual Visits (Telemedicine).  Patients are able to view lab/test results, encounter notes, upcoming appointments, etc.  Non-urgent messages can be sent to your provider as well.   To learn more about what you can do with MyChart, go to [email or web address].    Your next appointment:    12 months with Dr. Sallyanne Kuster  Other Instructions Please check your BP at home and send readings via MyChart in 1 week  HOW TO TAKE YOUR BLOOD PRESSURE: Rest 5 minutes before taking your blood pressure. Don't smoke or drink caffeinated beverages for at least 30 minutes before. Take your blood pressure before (not after) you eat. Sit comfortably with your back supported and both feet on the floor (don't cross your legs). Elevate your arm to heart level on a table or a desk. Use the proper sized cuff. It  should fit smoothly and snugly around your bare upper arm. There should be enough room to slip a fingertip under the cuff. The bottom edge of the cuff should be 1 inch above the crease of the elbow. Ideally, take 3 measurements at one sitting and record the average.

## 2022-04-07 NOTE — Progress Notes (Signed)
Cardiology Office Note:    Date:  04/07/2022   ID:  Gina Goodwin, DOB Jul 06, 1960, MRN [id number]  PCP:  Gina Koch, MD  Cardiologist:  Gina Goodwin:  None   Referring MD: Gina Goodwin, *   Chief Complaint  Patient presents with   Hypertension       Gina Goodwin is a 62 y.o. female who is being seen today for the evaluation of chest pain at the request of Gina Goodwin, *.   History of Present Illness:    Gina Goodwin is a 62 y.o. female with a hx of hypercholesterolemia, but otherwise good health, strong family history of premature death from coronary disease.  In 2021 she had a coronary calcium score of 0  [name]'s mother was [name], who was my patient for many years.  [name] did not have coronary problems but did have arrhythmia (pacemaker and atrial fibrillation).  She died from metastatic squamous cell cancer of the skin.  [name]'s sister [name] is also my patient (her coronary CTA showed no stenoses and a calcium score of 0).  [name] and [name]'s father had very early onset of coronary artery disease.  He had bypass surgery and a defibrillator and passed away at age 59.  Their paternal grandfather also died of heart disease at age 61.  Due to a variety of family and work obligations she has been paying less attention to her health.  She is gaining weight and her blood pressure is higher.  She is worried that her cholesterol numbers may have worsened as well.  Recently on a couple of occasions her blood pressure has been in the 140s over low 90s.  When she first checked in today her blood pressure was 160/96.  Even after relaxing for over 10 minutes the blood pressure was still high at 146/92.  She has not had any problems with chest discomfort but she has been fairly sedentary.  Her divorce is now complete.  She feels that she needs to dedicate more time to her health.  The patient specifically denies any chest  pain at rest exertion, dyspnea at rest or with exertion, orthopnea, paroxysmal nocturnal dyspnea, syncope, palpitations, focal neurological deficits, intermittent claudication, lower extremity edema, unexplained weight gain, cough, hemoptysis or wheezing.   We reviewed her diet today and she is eating a lot of sodium rich foods including luncheon meats and canned soups and convenient fast food.   Past Medical History:  Diagnosis Date   Adjustment disorder with anxious mood 07/07/2016   Chest heaviness 05/14/2014   Cough 07/11/2017   Cyst of skin 10/28/2015   Dyslipidemia    Hearing loss    wears aide on Left side   Hyperlipidemia    Overweight 05/11/2010   Qualifier: Diagnosis of  By: Asa Lente MD, Jannifer Rodney    Psoriasis    PSORIASIS 09/28/2009   Qualifier: Diagnosis of  By: Asa Lente MD, Jannifer Rodney     Past Surgical History:  Procedure Laterality Date   None      Current Medications: Current Meds  Medication Sig   atorvastatin (LIPITOR) 40 MG tablet Take 1 tablet (40 mg total) by mouth daily.   ezetimibe (ZETIA) 10 MG tablet TAKE ONE TABLET BY MOUTH EVERY DAY     Allergies:   Patient has no known allergies.   Social History   Socioeconomic History   Marital status: Divorced    Spouse name: Not on file   Number of  children: Not on file   Years of education: Not on file   Highest education level: Not on file  Occupational History   Not on file  Tobacco Use   Smoking status: Never   Smokeless tobacco: Never  Substance and Sexual Activity   Alcohol use: Yes    Comment: Occasional    Drug use: Not on file   Sexual activity: Not on file  Other Topics Concern   Not on file  Social History Narrative   Employed in Press photographer at [employer]. Divorced, 3 children -   Social Determinants of Radio broadcast assistant Strain: Not on file  Food Insecurity: Not on file  Transportation Needs: Not on file  Physical Activity: Not on file  Stress: Not on file  Social Connections:  Not on file     Family History: The patient's family history includes Breast cancer in her maternal aunt, maternal grandmother, and another family member; Heart disease in her father; Hyperlipidemia in her father.  ROS:   Please see the history of present illness.     All other systems reviewed and are negative.  EKGs/Labs/Other Studies Reviewed:    The following studies were reviewed today:   EKG:  EKG is ordered today.  It shows normal sinus rhythm and is a completely normal tracing.  QTc 434 ms  Recent Labs: No results found for requested labs within last 365 days.  Recent Lipid Panel    Component Value Date/Time   CHOL 182 03/22/2021 1145   TRIG 93 03/22/2021 1145   TRIG 88 08/27/2009 0000   HDL 68 03/22/2021 1145   CHOLHDL 2.7 03/22/2021 1145   CHOLHDL 3 07/14/2016 1230   VLDL 17.8 07/14/2016 1230   LDLCALC 97 03/22/2021 1145   LDLDIRECT 142.0 06/03/2012 1044    Physical Exam:    VS:  BP (!) 164/96 (BP Location: Left Arm, Patient Position: Sitting, Cuff Size: Normal)   Pulse 73   Ht 5' 5"$  (1.651 m)   Wt 163 lb 12.8 oz (74.3 kg)   SpO2 96%   BMI 27.26 kg/m     Wt Readings from Last 3 Encounters:  04/07/22 163 lb 12.8 oz (74.3 kg)  03/24/20 160 lb (72.6 kg)  05/08/19 166 lb 2 oz (75.4 kg)      General: Alert, oriented x3, no distress, mildly overweight Head: no evidence of trauma, PERRL, EOMI, no exophtalmos or lid lag, no myxedema, no xanthelasma; normal ears, nose and oropharynx Neck: normal jugular venous pulsations and no hepatojugular reflux; brisk carotid pulses without delay and no carotid bruits Chest: clear to auscultation, no signs of consolidation by percussion or palpation, normal fremitus, symmetrical and full respiratory excursions Cardiovascular: normal position and quality of the apical impulse, regular rhythm, normal first and second heart sounds, no murmurs, rubs or gallops Abdomen: no tenderness or distention, no masses by palpation, no  abnormal pulsatility or arterial bruits, normal bowel sounds, no hepatosplenomegaly Extremities: no clubbing, cyanosis or edema; 2+ radial, ulnar and brachial pulses bilaterally; 2+ right femoral, posterior tibial and dorsalis pedis pulses; 2+ left femoral, posterior tibial and dorsalis pedis pulses; no subclavian or femoral bruits Neurological: grossly nonfocal Psych: Normal mood and affect   ASSESSMENT:    1. Elevated blood pressure reading   2. Pure hypercholesterolemia     PLAN:    In order of problems listed above:  HTN: The first intervention will be to curtail her intake of sodium rich foods and we talked about  this in detail.  Also reviewed the importance of regular physical exercise and ways to manage emotional stress.  Will have her keep a log of her blood pressure for a while, before we decide whether or not she truly needs antihypertensive medications. HLP: Most recent lipid profile from roughly a year ago showed an acceptable LDL at 97 and a very good HDL at 68.  Repeat labs today.  Reassuringly low coronary calcium score a few years ago.   Medication Adjustments/Labs and Tests Ordered: Current medicines are reviewed at length with the patient today.  Concerns regarding medicines are outlined above.  Orders Placed This Encounter  Procedures   Lipid panel   EKG 12-Lead   No orders of the defined types were placed in this encounter.   Patient Instructions  Medication Instructions:  NO CHANGES  *If you need a refill on your cardiac medications before your next appointment, please call your pharmacy*   Lab Work: FASTING lab work to check cholesterol   If you have labs (blood work) drawn today and your tests are completely normal, you will receive your results only by: [place] (if you have MyChart) OR A paper copy in the mail If you have any lab test that is abnormal or we need to change your treatment, we will call you to review the  results.    Follow-Up: At [facility], you and your health needs are our priority.  As part of our continuing mission to provide you with exceptional heart care, we have created designated Provider Care Teams.  These Care Teams include your primary Cardiologist (physician) and Advanced Practice Providers (APPs -  Physician Assistants and Nurse Practitioners) who all work together to provide you with the care you need, when you need it.  We recommend signing up for the patient portal called "MyChart".  Sign up information is provided on this After Visit Summary.  MyChart is used to connect with patients for Virtual Visits (Telemedicine).  Patients are able to view lab/test results, encounter notes, upcoming appointments, etc.  Non-urgent messages can be sent to your provider as well.   To learn more about what you can do with MyChart, go to [email or web address].    Your next appointment:    12 months with Dr. Sallyanne Kuster  Other Instructions Please check your BP at home and send readings via MyChart in 1 week  HOW TO TAKE YOUR BLOOD PRESSURE: Rest 5 minutes before taking your blood pressure. Don't smoke or drink caffeinated beverages for at least 30 minutes before. Take your blood pressure before (not after) you eat. Sit comfortably with your back supported and both feet on the floor (don't cross your legs). Elevate your arm to heart level on a table or a desk. Use the proper sized cuff. It should fit smoothly and snugly around your bare upper arm. There should be enough room to slip a fingertip under the cuff. The bottom edge of the cuff should be 1 inch above the crease of the elbow. Ideally, take 3 measurements at one sitting and record the average.     Signed, Sanda Klein, MD  04/07/2022 8:51 PM    [place]

## 2022-04-08 LAB — LIPID PANEL
Chol/HDL Ratio: 2.5 ratio (ref 0.0–4.4)
Cholesterol, Total: 194 mg/dL (ref 100–199)
HDL: 79 mg/dL (ref >39)
LDL Chol Calc (NIH): 103 mg/dL — ABNORMAL HIGH (ref 0–99)
Triglycerides: 67 mg/dL (ref 0–149)
VLDL Cholesterol Cal: 12 mg/dL (ref 5–40)

## 2022-06-18 ENCOUNTER — Other Ambulatory Visit: Payer: Self-pay | Admitting: Cardiovascular Disease

## 2022-06-19 NOTE — Telephone Encounter (Signed)
Rx request sent to pharmacy.  

## 2022-09-14 ENCOUNTER — Other Ambulatory Visit: Payer: Self-pay | Admitting: Cardiovascular Disease

## 2023-01-02 ENCOUNTER — Telehealth: Payer: Self-pay | Admitting: Internal Medicine

## 2023-01-02 NOTE — Telephone Encounter (Signed)
Pt called needing lab work done for pre screening for her employment. Please reach to pt.  Best call number [phone]

## 2023-01-02 NOTE — Telephone Encounter (Signed)
I know she needs a approximant but would she be considered a new patient since she has not been seen since 2021

## 2023-01-02 NOTE — Telephone Encounter (Signed)
Yes would be new patient can re-establish if desired

## 2023-01-03 NOTE — Telephone Encounter (Signed)
Called patient and explained to her that she would need to re-establish as a new patient and a upcoming appointment was not guaranteed pt denied new patient appointment and stated she will find something else in the meantime.

## 2023-02-26 ENCOUNTER — Other Ambulatory Visit: Payer: Self-pay | Admitting: Obstetrics & Gynecology

## 2023-02-26 DIAGNOSIS — Z1231 Encounter for screening mammogram for malignant neoplasm of breast: Secondary | ICD-10-CM

## 2023-04-09 ENCOUNTER — Ambulatory Visit
Admission: RE | Admit: 2023-04-09 | Discharge: 2023-04-09 | Disposition: A | Discharge status: Home / Self Care | Payer: BC Managed Care – PPO | Source: Ambulatory Visit | Attending: Obstetrics & Gynecology | Admitting: Obstetrics & Gynecology

## 2023-04-09 DIAGNOSIS — Z1231 Encounter for screening mammogram for malignant neoplasm of breast: Secondary | ICD-10-CM

## 2023-04-17 ENCOUNTER — Ambulatory Visit: Payer: BC Managed Care – PPO | Attending: Cardiovascular Disease | Admitting: Cardiovascular Disease

## 2023-04-18 ENCOUNTER — Encounter: Payer: Self-pay | Admitting: Cardiovascular Disease

## 2023-05-18 ENCOUNTER — Ambulatory Visit: Payer: No Typology Code available for payment source | Admitting: Cardiovascular Disease

## 2023-06-06 ENCOUNTER — Ambulatory Visit: Payer: BC Managed Care – PPO | Attending: Cardiovascular Disease | Admitting: Cardiovascular Disease

## 2023-06-06 ENCOUNTER — Encounter: Payer: Self-pay | Admitting: Cardiovascular Disease

## 2023-06-06 VITALS — BP 118/74 | HR 75 | Ht 64.5 in | Wt 142.2 lb

## 2023-06-06 DIAGNOSIS — R030 Elevated blood-pressure reading, without diagnosis of hypertension: Secondary | ICD-10-CM

## 2023-06-06 DIAGNOSIS — E7800 Pure hypercholesterolemia, unspecified: Secondary | ICD-10-CM

## 2023-06-06 NOTE — Patient Instructions (Signed)
 Medication Instructions:  No changes *If you need a refill on your cardiac medications before your next appointment, please call your pharmacy*  Lab Work: Lipid panel- Fasting lab work in3 months If you have labs (blood work) drawn today and your tests are completely normal, you will receive your results only by: MyChart Message (if you have MyChart) OR A paper copy in the mail If you have any lab test that is abnormal or we need to change your treatment, we will call you to review the results.   Follow-Up: At [facility], you and your health needs are our priority.  As part of our continuing mission to provide you with exceptional heart care, our providers are all part of one team.  This team includes your primary Cardiologist (physician) and Advanced Practice Providers or APPs (Physician Assistants and Nurse Practitioners) who all work together to provide you with the care you need, when you need it.  Your next appointment:   1 year(s)  Provider:   Thurmon Fair, MD     We recommend signing up for the patient portal called "MyChart".  Sign up information is provided on this After Visit Summary.  MyChart is used to connect with patients for Virtual Visits (Telemedicine).  Patients are able to view lab/test results, encounter notes, upcoming appointments, etc.  Non-urgent messages can be sent to your provider as well.   To learn more about what you can do with MyChart, go to [email or web address].        1st Floor: - Lobby - Registration  - Pharmacy  - Lab - Cafe  2nd Floor: - PV Lab - Diagnostic Testing (echo, CT, nuclear med)  3rd Floor: - Vacant  4th Floor: - TCTS (cardiothoracic surgery) - AFib Clinic - Structural Heart Clinic - Vascular Surgery  - Vascular Ultrasound  5th Floor: - HeartCare Cardiology (general and EP) - Clinical Pharmacy for coumadin, hypertension, lipid, weight-loss medications, and med management appointments    Valet parking  services will be available as well.

## 2023-06-06 NOTE — Progress Notes (Addendum)
 Cardiology Office Note:    Date:  06/10/2023   ID:  [name], DOB [birth year], MRN [id number]  PCP:  Adelia Homestead, MD  Cardiologist:  New Electrophysiologist:  None   Referring MD: Adelia Homestead, *   Chief Complaint  Patient presents with   Hyperlipidemia     History of Present Illness:    Gina Goodwin is a 63 y.o. female with a hx of hypercholesterolemia, but otherwise good health, strong family history of premature death from coronary disease.  In 2021 she had a coronary calcium score of 0.  Gina Goodwin's mother was Gina Goodwin, who was my patient for many years.  Gina Goodwin did not have coronary problems but did have arrhythmia (pacemaker and atrial fibrillation).  She died from metastatic squamous cell cancer of the skin.  Gina Goodwin's sister Gina Goodwin is also my patient (her coronary CTA showed no stenoses and a calcium score of 0).  Gina Goodwin and Gina Goodwin's father had very early onset of coronary artery disease.  He had bypass surgery and a defibrillator and passed away at age 92.  Their paternal grandfather also died of heart disease at age 4.  The patient specifically denies any chest pain at rest or with exertion, dyspnea at rest or with exertion, orthopnea, paroxysmal nocturnal dyspnea, syncope, palpitations, focal neurological deficits, intermittent claudication, lower extremity edema, unexplained weight gain, cough, hemoptysis or wheezing.   She is taking compounded Ozempic, has lost 23 lb, has normal BP.   Past Medical History:  Diagnosis Date   Adjustment disorder with anxious mood 07/07/2016   Chest heaviness 05/14/2014   Cough 07/11/2017   Cyst of skin 10/28/2015   Dyslipidemia    Hearing loss    wears aide on Left side   Hyperlipidemia    Overweight 05/11/2010   Qualifier: Diagnosis of  By: Myrtice Athens MD, Rufina Cough    Psoriasis    PSORIASIS 09/28/2009   Qualifier: Diagnosis of  By: Myrtice Athens MD, Rufina Cough     Past Surgical History:  Procedure  Laterality Date   None      Current Medications: Current Meds  Medication Sig   atorvastatin (LIPITOR) 40 MG tablet TAKE ONE TABLET BY MOUTH EVERY DAY   ezetimibe (ZETIA) 10 MG tablet TAKE ONE TABLET BY MOUTH EVERY DAY   Semaglutide (OZEMPIC, 0.25 OR 0.5 MG/DOSE, [place]) Inject 0.5 mg into the skin. Once every ten days     Allergies:   Patient has no known allergies.   Family History: The patient's family history includes Breast cancer in her maternal aunt, maternal grandmother, and another family member; Heart disease in her father; Hyperlipidemia in her father.  ROS:   Please see the history of present illness.     All other systems reviewed and are negative.  EKGs/Labs/Other Studies Reviewed:    The following studies were reviewed today:   EKG:    EKG Interpretation Date/Time:  Wednesday June 06 2023 09:38:08 EDT Ventricular Rate:  75 PR Interval:  130 QRS Duration:  76 QT Interval:  388 QTC Calculation: 433 R Axis:   -9  Text Interpretation: Normal sinus rhythm Normal ECG No previous ECGs available Confirmed by Dua Mehler ([id number]) on 06/06/2023 10:02:23 AM         Recent Labs: No results found for requested labs within last 365 days.  Recent Lipid Panel    Component Value Date/Time   CHOL 194 04/07/2022 1126   TRIG 67 04/07/2022 1126   TRIG 88 08/27/2009 0000  HDL 79 04/07/2022 1126   CHOLHDL 2.5 04/07/2022 1126   CHOLHDL 3 07/14/2016 1230   VLDL 17.8 07/14/2016 1230   LDLCALC 103 (H) 04/07/2022 1126   LDLDIRECT 142.0 06/03/2012 1044    Physical Exam:    VS:  BP 118/74 (BP Location: Left Arm, Patient Position: Sitting, Cuff Size: Small)   Pulse 75   Ht 5' 4.5" (1.638 m)   Wt 142 lb 3.2 oz (64.5 kg)   SpO2 97%   BMI 24.03 kg/m     Wt Readings from Last 3 Encounters:  06/06/23 142 lb 3.2 oz (64.5 kg)  04/07/22 163 lb 12.8 oz (74.3 kg)  03/24/20 160 lb (72.6 kg)      General: Alert, oriented x3, no distress, lean Head: no evidence of  trauma, PERRL, EOMI, no exophtalmos or lid lag, no myxedema, no xanthelasma; normal ears, nose and oropharynx Neck: normal jugular venous pulsations and no hepatojugular reflux; brisk carotid pulses without delay and no carotid bruits Chest: clear to auscultation, no signs of consolidation by percussion or palpation, normal fremitus, symmetrical and full respiratory excursions Cardiovascular: normal position and quality of the apical impulse, regular rhythm, normal first and second heart sounds, no murmurs, rubs or gallops Abdomen: no tenderness or distention, no masses by palpation, no abnormal pulsatility or arterial bruits, normal bowel sounds, no hepatosplenomegaly Extremities: no clubbing, cyanosis or edema; 2+ radial, ulnar and brachial pulses bilaterally; 2+ right femoral, posterior tibial and dorsalis pedis pulses; 2+ left femoral, posterior tibial and dorsalis pedis pulses; no subclavian or femoral bruits Neurological: grossly nonfocal Psych: Normal mood and affect    ASSESSMENT:    1. Elevated blood pressure reading   2. Pure hypercholesterolemia     PLAN:    In order of problems listed above:  HTN: controlled with diet and weight loss. HLP: stop zetia, repeat labs after substantial weight loss.Reassuringly low coronary calcium score a few years ago.   Medication Adjustments/Labs and Tests Ordered: Current medicines are reviewed at length with the patient today.  Concerns regarding medicines are outlined above.  Orders Placed This Encounter  Procedures   Lipid panel   EKG 12-Lead  Stop zetia No orders of the defined types were placed in this encounter.   Patient Instructions  Medication Instructions:  No changes *If you need a refill on your cardiac medications before your next appointment, please call your pharmacy*  Lab Work: Lipid panel- Fasting lab work in3 months If you have labs (blood work) drawn today and your tests are completely normal, you will receive  your results only by: MyChart Message (if you have MyChart) OR A paper copy in the mail If you have any lab test that is abnormal or we need to change your treatment, we will call you to review the results.   Follow-Up: At [facility], you and your health needs are our priority.  As part of our continuing mission to provide you with exceptional heart care, our providers are all part of one team.  This team includes your primary Cardiologist (physician) and Advanced Practice Providers or APPs (Physician Assistants and Nurse Practitioners) who all work together to provide you with the care you need, when you need it.  Your next appointment:   1 year(s)  Provider:   Luana Rumple, MD     We recommend signing up for the patient portal called "MyChart".  Sign up information is provided on this After Visit Summary.  MyChart is used to connect with patients  for Virtual Visits (Telemedicine).  Patients are able to view lab/test results, encounter notes, upcoming appointments, etc.  Non-urgent messages can be sent to your provider as well.   To learn more about what you can do with MyChart, go to [email or web address].        1st Floor: - Lobby - Registration  - Pharmacy  - Lab - Cafe  2nd Floor: - PV Lab - Diagnostic Testing (echo, CT, nuclear med)  3rd Floor: - Vacant  4th Floor: - TCTS (cardiothoracic surgery) - AFib Clinic - Structural Heart Clinic - Vascular Surgery  - Vascular Ultrasound  5th Floor: - HeartCare Cardiology (general and EP) - Clinical Pharmacy for coumadin, hypertension, lipid, weight-loss medications, and med management appointments    Valet parking services will be available as well.      Signed, Luana Rumple, MD  06/10/2023 7:35 PM    [facility]

## 2023-06-10 ENCOUNTER — Encounter: Payer: Self-pay | Admitting: Cardiovascular Disease

## 2023-08-27 ENCOUNTER — Telehealth: Payer: Self-pay | Admitting: Cardiovascular Disease

## 2023-08-27 MED ORDER — ATORVASTATIN CALCIUM 40 MG PO TABS: 40.0000 mg | ORAL_TABLET | Freq: Every day | ORAL | 2 refills | Status: DC

## 2023-08-27 NOTE — Telephone Encounter (Signed)
*  STAT* If patient is at the pharmacy, call can be transferred to refill team.   1. Which medications need to be refilled? (please list name of each medication and dose if known) atorvastatin  (LIPITOR) 40 MG tablet   2. Which pharmacy/location (including street and city if local pharmacy) is medication to be sent to? [id number] - [place], [place] - [address] AT [facility] [address] [facility] [address] Phone: [phone]  Fax: [phone]     3. Do they need a 30 day or 90 day supply? 90 Pt is out of medication

## 2023-08-27 NOTE — Telephone Encounter (Signed)
 Pt's medication was sent to pt's pharmacy as requested. Confirmation received.

## 2023-09-14 ENCOUNTER — Encounter: Payer: Self-pay | Admitting: Emergency Medicine

## 2023-11-17 ENCOUNTER — Ambulatory Visit: Payer: Self-pay | Admitting: Cardiovascular Disease

## 2023-11-17 DIAGNOSIS — E7800 Pure hypercholesterolemia, unspecified: Secondary | ICD-10-CM

## 2023-11-17 DIAGNOSIS — Z79899 Other long term (current) drug therapy: Secondary | ICD-10-CM

## 2023-11-17 LAB — LIPID PANEL
Chol/HDL Ratio: 2.2 ratio (ref 0.0–4.4)
Cholesterol, Total: 221 mg/dL — ABNORMAL HIGH (ref 100–199)
HDL: 100 mg/dL (ref >39)
LDL Chol Calc (NIH): 110 mg/dL — ABNORMAL HIGH (ref 0–99)
Triglycerides: 61 mg/dL (ref 0–149)
VLDL Cholesterol Cal: 11 mg/dL (ref 5–40)

## 2023-11-20 ENCOUNTER — Other Ambulatory Visit: Payer: Self-pay | Admitting: *Deleted

## 2023-11-20 DIAGNOSIS — E7800 Pure hypercholesterolemia, unspecified: Secondary | ICD-10-CM

## 2023-11-20 DIAGNOSIS — Z79899 Other long term (current) drug therapy: Secondary | ICD-10-CM

## 2023-11-20 MED ORDER — ATORVASTATIN CALCIUM 80 MG PO TABS: 80.0000 mg | ORAL_TABLET | Freq: Every day | ORAL | 3 refills | Status: AC

## 2023-11-20 MED ORDER — EZETIMIBE 10 MG PO TABS: 10.0000 mg | ORAL_TABLET | Freq: Every day | ORAL | 3 refills | Status: DC

## 2023-11-28 ENCOUNTER — Encounter: Payer: Self-pay | Admitting: Internal Medicine

## 2023-12-07 ENCOUNTER — Telehealth: Payer: Self-pay | Admitting: Cardiovascular Disease

## 2023-12-07 ENCOUNTER — Ambulatory Visit: Payer: Self-pay

## 2023-12-07 NOTE — Telephone Encounter (Signed)
 Pt c/o medication issue:  1. Name of Medication: atorvastatin  (LIPITOR) 80 MG tablet   2. How are you currently taking this medication (dosage and times per day)? As written  3. Are you having a reaction (difficulty breathing--STAT)? No  4. What is your medication issue? Pt states that she has been having diarrhea for 4 days and would like to know if the increased dosage of medication can cause this. Please advise

## 2023-12-07 NOTE — Telephone Encounter (Signed)
 Called patient left message on personal voice mail to call back.

## 2023-12-07 NOTE — Telephone Encounter (Signed)
 Pt to go to UC for evaluation if no improvement. Will contact GYN regarding Urine culture to confirm continuation of amoxi. Will contact cardio to ask if could be r/t lipitor increase.  FYI Only or Action Required?: FYI only for provider.  Patient was last seen in primary care on NA.  Called Nurse Triage reporting Diarrhea.  Symptoms began several days ago.  Interventions attempted: Rest, hydration, or home remedies.  Symptoms are: gradually improving.  Triage Disposition: See Physician Within 24 Hours  Patient/caregiver understands and will follow disposition?:   Reason for Disposition  [1] MODERATE diarrhea (e.g., 4-6 times / day more than normal) AND [2] present > 48 hours (2 days)  Answer Assessment - Initial Assessment Questions Patient reports 4 day hx of diarrhea, is being treated for a UTI with amoxi but started after symptoms. Took generic imodium this morning, is helping.   Was on ozempic for one month. Has not taken ozempic in one month, has been titrtating down.  Cardiologist increased lipitor dose one week ago.  1. DIARRHEA SEVERITY: How bad is the diarrhea? How many more stools have you had in the past 24 hours than normal?      Up to 5 times per day  2. ONSET: When did the diarrhea begin?      11/04/23  3. STOOL DESCRIPTION:  How loose or watery is the diarrhea? What is the stool color? Is there any blood or mucous in the stool?     Light brown, watery  4. VOMITING: Are you also vomiting? If Yes, ask: How many times in the past 24 hours?      Denies  5. ABDOMEN PAIN: Are you having any abdomen pain? If Yes, ask: What does it feel like? (e.g., crampy, dull, intermittent, constant)      Lower abdominal cramping  10. ANTIBIOTIC USE: Are you taking antibiotics now or have you taken antibiotics in the past 2 months?       Started amoxi on 12/06/23 after diarrhea onset  11. OTHER SYMPTOMS: Do you have any other symptoms? (e.g., fever, blood  in stool)       Denies  Protocols used: [facility]  Copied from CRM #[id number]. Topic: Clinical - Red Word Triage >> Dec 07, 2023  8:05 AM Laymon HERO wrote: Red Word that prompted transfer to Nurse Triage: Patient has had diarrhea for last 4 days. Was seen at her OB and her white blood cell count was high so she was given an antibiotic. Did have a fever yesterday as well. Has not seen Dr Rollene since 2021

## 2023-12-12 NOTE — Telephone Encounter (Signed)
 Spoke with pt, she is feeling better and is having no diarrhea at this time.

## 2024-02-20 LAB — LIPID PANEL
Chol/HDL Ratio: 1.8 ratio (ref 0.0–4.4)
Cholesterol, Total: 127 mg/dL (ref 100–199)
HDL: 72 mg/dL
LDL Chol Calc (NIH): 44 mg/dL (ref 0–99)
Triglycerides: 43 mg/dL (ref 0–149)
VLDL Cholesterol Cal: 11 mg/dL (ref 5–40)

## 2024-02-22 ENCOUNTER — Ambulatory Visit: Payer: Self-pay | Admitting: Cardiovascular Disease

## 2024-02-25 NOTE — Telephone Encounter (Signed)
 Zetia  removed from medication list.

## 2024-03-07 ENCOUNTER — Encounter: Payer: Self-pay | Admitting: Cardiovascular Disease

## 2024-03-07 ENCOUNTER — Encounter: Payer: Self-pay | Admitting: Internal Medicine

## 2024-03-20 ENCOUNTER — Other Ambulatory Visit: Payer: Self-pay | Admitting: Obstetrics & Gynecology

## 2024-03-20 DIAGNOSIS — Z1231 Encounter for screening mammogram for malignant neoplasm of breast: Secondary | ICD-10-CM

## 2024-04-10 ENCOUNTER — Ambulatory Visit

## 2024-04-18 ENCOUNTER — Ambulatory Visit

## 2024-04-29 ENCOUNTER — Encounter: Admitting: Internal Medicine
